# Patient Record
Sex: Female | Born: 1940
Health system: Southern US, Community
[De-identification: ages and names within clinical notes are randomized; demographics above are authoritative.]

## PROBLEM LIST (undated history)

## (undated) DIAGNOSIS — E7800 Pure hypercholesterolemia, unspecified: Secondary | ICD-10-CM

## (undated) DIAGNOSIS — J449 Chronic obstructive pulmonary disease, unspecified: Secondary | ICD-10-CM

## (undated) HISTORY — PX: ABDOMINAL HYSTERECTOMY: SHX81

---

## 1968-01-02 DIAGNOSIS — C55 Malignant neoplasm of uterus, part unspecified: Secondary | ICD-10-CM

## 1968-01-02 HISTORY — DX: Malignant neoplasm of uterus, part unspecified: C55

## 1997-06-14 ENCOUNTER — Other Ambulatory Visit: Admission: RE | Admit: 1997-06-14 | Discharge: 1997-06-14 | Payer: Self-pay | Admitting: Endocrinology

## 1997-08-13 ENCOUNTER — Ambulatory Visit (HOSPITAL_COMMUNITY): Admission: RE | Admit: 1997-08-13 | Discharge: 1997-08-13 | Payer: Self-pay | Admitting: Endocrinology

## 1998-06-23 ENCOUNTER — Other Ambulatory Visit: Admission: RE | Admit: 1998-06-23 | Discharge: 1998-06-23 | Payer: Self-pay | Admitting: Endocrinology

## 1999-01-18 ENCOUNTER — Ambulatory Visit (HOSPITAL_COMMUNITY): Admission: RE | Admit: 1999-01-18 | Discharge: 1999-01-18 | Payer: Self-pay | Admitting: Endocrinology

## 1999-01-18 ENCOUNTER — Encounter: Payer: Self-pay | Admitting: Endocrinology

## 1999-10-02 ENCOUNTER — Other Ambulatory Visit: Admission: RE | Admit: 1999-10-02 | Discharge: 1999-10-02 | Payer: Self-pay | Admitting: Endocrinology

## 2000-11-19 ENCOUNTER — Encounter: Payer: Self-pay | Admitting: Obstetrics and Gynecology

## 2000-11-19 ENCOUNTER — Encounter: Admission: RE | Admit: 2000-11-19 | Discharge: 2000-11-19 | Payer: Self-pay | Admitting: Obstetrics and Gynecology

## 2003-06-24 ENCOUNTER — Encounter: Admission: RE | Admit: 2003-06-24 | Discharge: 2003-06-24 | Payer: Self-pay | Admitting: Endocrinology

## 2004-02-15 ENCOUNTER — Ambulatory Visit (HOSPITAL_COMMUNITY): Admission: RE | Admit: 2004-02-15 | Discharge: 2004-02-15 | Payer: Self-pay | Admitting: *Deleted

## 2004-02-15 ENCOUNTER — Encounter (INDEPENDENT_AMBULATORY_CARE_PROVIDER_SITE_OTHER): Payer: Self-pay | Admitting: Specialist

## 2004-12-14 ENCOUNTER — Encounter: Admission: RE | Admit: 2004-12-14 | Discharge: 2004-12-14 | Payer: Self-pay | Admitting: Endocrinology

## 2005-01-10 ENCOUNTER — Encounter: Admission: RE | Admit: 2005-01-10 | Discharge: 2005-01-10 | Payer: Self-pay | Admitting: Endocrinology

## 2006-10-13 ENCOUNTER — Emergency Department (HOSPITAL_COMMUNITY): Admission: EM | Admit: 2006-10-13 | Discharge: 2006-10-13 | Payer: Self-pay | Admitting: Emergency Medicine

## 2010-05-19 NOTE — Op Note (Signed)
NAMECHASLYN, Sarah Rojas NO.:  [id number]   MEDICAL RECORD NO.:  [id number]          PATIENT TYPE:  AMB   LOCATION:  ENDO                         FACILITY:  [facility]   PHYSICIAN:  Georgiana Spinner, M.D.    DATE OF BIRTH:  [birth year]   DATE OF PROCEDURE:  DATE OF DISCHARGE:                                 OPERATIVE REPORT   PROCEDURE:  Upper endoscopy.   INDICATION:  GERD.   ANESTHESIA:  Demerol 75 mg, Versed 7.5 mg.   DESCRIPTION OF PROCEDURE:  With the patient mildly sedated in the left  lateral decubitus position, the Olympus videoscopic endoscope was inserted  into the mouth and passed under direct vision through the esophagus which  appeared normal until it reached the distal esophagus and there was one  small patch of an island of Barrett's esophagus, it was photographed and  biopsied.  The endoscope was then advanced into the stomach and the fundus,  body, antrum, duodenal bulb, and second portion of the duodenum were  visualized.  From this point, the endoscope was slowly withdrawn, taking  circumferential views of the duodenal mucosa until the endoscope had been  pulled into the stomach and placed in retroflexion, viewing the stomach from  below and a widely patent GE junction was noted.  We could see up the  esophagus.  The endoscope was then straightened and withdrawn, taking  circumferential views of the remaining gastric and esophageal mucosa,  stopping in the body of the stomach where a diffuse erythema was seen,  photographed and biopsied.  The patient's vital signs and pulse oximetry  remained stable, the patient tolerated the procedure well without apparent  complications.   FINDINGS:  1.  Diffuse gastritis, biopsied.  2.  Island of Barrett's esophagus, biopsied.   PLAN:  Await biopsy report, the patient will call me for results and follow  up with me as an outpatient.   Of note, photographs taken were lost due to a printer malfunction.      GMO/MEDQ  D:  02/15/2004  T:  02/15/2004  Job:  [id number]

## 2010-05-19 NOTE — Op Note (Signed)
NAMECHELSEI, MCCHESNEY NO.:  [id number]   MEDICAL RECORD NO.:  [id number]          PATIENT TYPE:  AMB   LOCATION:  ENDO                         FACILITY:  [facility]   PHYSICIAN:  Georgiana Spinner, M.D.    DATE OF BIRTH:  Feb 10, 1940   DATE OF PROCEDURE:  02/15/2004  DATE OF DISCHARGE:                                 OPERATIVE REPORT   PROCEDURE:  Colonoscopy.   INDICATIONS:  Polyps.   ANESTHESIA:  None further given.   DESCRIPTION OF PROCEDURE:  With the patient mildly sedated in the left  lateral decubitus position, the Olympus videoscopic colonoscope was inserted  in the rectum and passed under direct vision to the cecum, identified by  ileocecal valve and appendiceal orifice, both of which were photographed.  From this point, the colonoscope was slowly withdrawn, taking  circumferential views of the colonic mucosa, stopping at approximately 40 cm  from the anal verge.  A number of small polyps were seen.  Some were  photographed and all were removed using either hot biopsy forceps technique  or snare cautery technique on a setting of 20/200 blended current.  We then  pulled back to the rectum, which appeared normal in direct view and showed a  small polyp and hemorrhoids on retroflexed view.  The polyp was removed  using hot biopsy forceps technique at the same setting.  The endoscope was  then straightened and withdrawn.  The patient's vital signs and pulse  oximetry remained stable.  The patient tolerated the procedure well without  apparent complication.   FINDINGS:  1.  Multiple polyps at around 40 cm from the anal verge.  2.  Polyp of the rectum.  3.  Internal hemorrhoids.  4.  Otherwise an unremarkable examination.   PLAN:  Await biopsy reports.  The patient will call me for results and  follow up with me as an outpatient.      GMO/MEDQ  D:  02/15/2004  T:  02/15/2004  Job:  [id number]

## 2010-10-12 LAB — BASIC METABOLIC PANEL
CO2: 26
Calcium: 9.5
Chloride: 101
GFR calc non Af Amer: >60

## 2010-10-12 LAB — DIFFERENTIAL
Eosinophils Absolute: 0.2
Lymphocytes Relative: 10 — ABNORMAL LOW
Monocytes Absolute: 0.8 — ABNORMAL HIGH
Monocytes Relative: 5
Neutro Abs: 12 — ABNORMAL HIGH

## 2010-10-12 LAB — CULTURE, BLOOD (ROUTINE X 2)
Culture: NO GROWTH
Culture: NO GROWTH

## 2010-10-12 LAB — CBC
HCT: 39.1
MCHC: 35.1
MCV: 92.9
Platelets: 298

## 2014-03-01 DIAGNOSIS — H524 Presbyopia: Secondary | ICD-10-CM | POA: Diagnosis not present

## 2014-03-01 DIAGNOSIS — H5213 Myopia, bilateral: Secondary | ICD-10-CM | POA: Diagnosis not present

## 2014-03-01 DIAGNOSIS — H52209 Unspecified astigmatism, unspecified eye: Secondary | ICD-10-CM | POA: Diagnosis not present

## 2014-03-01 DIAGNOSIS — Z0100 Encounter for examination of eyes and vision without abnormal findings: Secondary | ICD-10-CM | POA: Diagnosis not present

## 2014-03-25 DIAGNOSIS — E0789 Other specified disorders of thyroid: Secondary | ICD-10-CM | POA: Diagnosis not present

## 2014-03-25 DIAGNOSIS — E756 Lipid storage disorder, unspecified: Secondary | ICD-10-CM | POA: Diagnosis not present

## 2014-03-25 DIAGNOSIS — Z79899 Other long term (current) drug therapy: Secondary | ICD-10-CM | POA: Diagnosis not present

## 2014-04-06 DIAGNOSIS — J449 Chronic obstructive pulmonary disease, unspecified: Secondary | ICD-10-CM | POA: Diagnosis not present

## 2014-04-06 DIAGNOSIS — F17200 Nicotine dependence, unspecified, uncomplicated: Secondary | ICD-10-CM | POA: Diagnosis not present

## 2014-04-06 DIAGNOSIS — L309 Dermatitis, unspecified: Secondary | ICD-10-CM | POA: Diagnosis not present

## 2014-04-06 DIAGNOSIS — R5383 Other fatigue: Secondary | ICD-10-CM | POA: Diagnosis not present

## 2014-04-06 DIAGNOSIS — F419 Anxiety disorder, unspecified: Secondary | ICD-10-CM | POA: Diagnosis not present

## 2014-10-07 DIAGNOSIS — E789 Disorder of lipoprotein metabolism, unspecified: Secondary | ICD-10-CM | POA: Diagnosis not present

## 2014-10-20 DIAGNOSIS — F5101 Primary insomnia: Secondary | ICD-10-CM | POA: Diagnosis not present

## 2014-10-20 DIAGNOSIS — E789 Disorder of lipoprotein metabolism, unspecified: Secondary | ICD-10-CM | POA: Diagnosis not present

## 2014-10-20 DIAGNOSIS — Z23 Encounter for immunization: Secondary | ICD-10-CM | POA: Diagnosis not present

## 2014-10-20 DIAGNOSIS — R5383 Other fatigue: Secondary | ICD-10-CM | POA: Diagnosis not present

## 2014-10-20 DIAGNOSIS — R002 Palpitations: Secondary | ICD-10-CM | POA: Diagnosis not present

## 2015-01-24 DIAGNOSIS — E789 Disorder of lipoprotein metabolism, unspecified: Secondary | ICD-10-CM | POA: Diagnosis not present

## 2015-01-24 DIAGNOSIS — R05 Cough: Secondary | ICD-10-CM | POA: Diagnosis not present

## 2015-01-24 DIAGNOSIS — R0602 Shortness of breath: Secondary | ICD-10-CM | POA: Diagnosis not present

## 2015-04-04 DIAGNOSIS — F5101 Primary insomnia: Secondary | ICD-10-CM | POA: Diagnosis not present

## 2015-04-04 DIAGNOSIS — R002 Palpitations: Secondary | ICD-10-CM | POA: Diagnosis not present

## 2015-04-04 DIAGNOSIS — E0789 Other specified disorders of thyroid: Secondary | ICD-10-CM | POA: Diagnosis not present

## 2015-04-04 DIAGNOSIS — E789 Disorder of lipoprotein metabolism, unspecified: Secondary | ICD-10-CM | POA: Diagnosis not present

## 2015-04-12 DIAGNOSIS — J42 Unspecified chronic bronchitis: Secondary | ICD-10-CM | POA: Diagnosis not present

## 2015-04-12 DIAGNOSIS — B029 Zoster without complications: Secondary | ICD-10-CM | POA: Diagnosis not present

## 2015-04-12 DIAGNOSIS — M25511 Pain in right shoulder: Secondary | ICD-10-CM | POA: Diagnosis not present

## 2015-04-12 DIAGNOSIS — M25519 Pain in unspecified shoulder: Secondary | ICD-10-CM | POA: Diagnosis not present

## 2015-04-12 DIAGNOSIS — E039 Hypothyroidism, unspecified: Secondary | ICD-10-CM | POA: Diagnosis not present

## 2015-04-12 DIAGNOSIS — J449 Chronic obstructive pulmonary disease, unspecified: Secondary | ICD-10-CM | POA: Diagnosis not present

## 2015-04-13 DIAGNOSIS — H353131 Nonexudative age-related macular degeneration, bilateral, early dry stage: Secondary | ICD-10-CM | POA: Diagnosis not present

## 2015-04-13 DIAGNOSIS — Z961 Presence of intraocular lens: Secondary | ICD-10-CM | POA: Diagnosis not present

## 2015-04-13 DIAGNOSIS — B0230 Zoster ocular disease, unspecified: Secondary | ICD-10-CM | POA: Diagnosis not present

## 2015-04-13 DIAGNOSIS — H04123 Dry eye syndrome of bilateral lacrimal glands: Secondary | ICD-10-CM | POA: Diagnosis not present

## 2015-05-10 DIAGNOSIS — H04123 Dry eye syndrome of bilateral lacrimal glands: Secondary | ICD-10-CM | POA: Diagnosis not present

## 2015-05-10 DIAGNOSIS — H353131 Nonexudative age-related macular degeneration, bilateral, early dry stage: Secondary | ICD-10-CM | POA: Diagnosis not present

## 2015-05-10 DIAGNOSIS — Z961 Presence of intraocular lens: Secondary | ICD-10-CM | POA: Diagnosis not present

## 2015-06-06 DIAGNOSIS — M542 Cervicalgia: Secondary | ICD-10-CM | POA: Diagnosis not present

## 2015-06-06 DIAGNOSIS — R1310 Dysphagia, unspecified: Secondary | ICD-10-CM | POA: Diagnosis not present

## 2015-06-07 DIAGNOSIS — M25411 Effusion, right shoulder: Secondary | ICD-10-CM | POA: Diagnosis not present

## 2015-06-07 DIAGNOSIS — M2540 Effusion, unspecified joint: Secondary | ICD-10-CM | POA: Diagnosis not present

## 2015-06-07 DIAGNOSIS — M47812 Spondylosis without myelopathy or radiculopathy, cervical region: Secondary | ICD-10-CM | POA: Diagnosis not present

## 2015-06-08 ENCOUNTER — Other Ambulatory Visit: Payer: Self-pay | Admitting: Orthopaedic Surgery

## 2015-06-08 DIAGNOSIS — M8580 Other specified disorders of bone density and structure, unspecified site: Secondary | ICD-10-CM

## 2015-06-10 ENCOUNTER — Other Ambulatory Visit: Payer: Self-pay | Admitting: Orthopaedic Surgery

## 2015-06-10 DIAGNOSIS — M4802 Spinal stenosis, cervical region: Secondary | ICD-10-CM

## 2015-06-17 ENCOUNTER — Ambulatory Visit
Admission: RE | Admit: 2015-06-17 | Discharge: 2015-06-17 | Disposition: A | Discharge status: Home / Self Care | Payer: Commercial Managed Care - HMO | Source: Ambulatory Visit | Attending: Orthopaedic Surgery | Admitting: Orthopaedic Surgery

## 2015-06-17 DIAGNOSIS — M4802 Spinal stenosis, cervical region: Secondary | ICD-10-CM

## 2015-06-17 DIAGNOSIS — M50223 Other cervical disc displacement at C6-C7 level: Secondary | ICD-10-CM | POA: Diagnosis not present

## 2015-06-17 DIAGNOSIS — M50221 Other cervical disc displacement at C4-C5 level: Secondary | ICD-10-CM | POA: Diagnosis not present

## 2015-06-17 MED ORDER — GADOBENATE DIMEGLUMINE 529 MG/ML IV SOLN
9.0000 mL | Freq: Once | INTRAVENOUS | Status: AC | PRN
  Administered 2015-06-17: 9 mL via INTRAVENOUS

## 2015-06-24 ENCOUNTER — Ambulatory Visit
Admission: RE | Admit: 2015-06-24 | Discharge: 2015-06-24 | Disposition: A | Discharge status: Home / Self Care | Payer: Commercial Managed Care - HMO | Source: Ambulatory Visit | Attending: Orthopaedic Surgery | Admitting: Orthopaedic Surgery

## 2015-06-24 ENCOUNTER — Other Ambulatory Visit: Payer: Self-pay

## 2015-06-24 DIAGNOSIS — M8580 Other specified disorders of bone density and structure, unspecified site: Secondary | ICD-10-CM

## 2015-06-24 DIAGNOSIS — M810 Age-related osteoporosis without current pathological fracture: Secondary | ICD-10-CM | POA: Diagnosis not present

## 2015-06-24 DIAGNOSIS — Z780 Asymptomatic menopausal state: Secondary | ICD-10-CM | POA: Diagnosis not present

## 2015-06-28 DIAGNOSIS — M810 Age-related osteoporosis without current pathological fracture: Secondary | ICD-10-CM | POA: Diagnosis not present

## 2015-06-28 DIAGNOSIS — G5601 Carpal tunnel syndrome, right upper limb: Secondary | ICD-10-CM | POA: Diagnosis not present

## 2015-06-28 DIAGNOSIS — M542 Cervicalgia: Secondary | ICD-10-CM | POA: Diagnosis not present

## 2015-06-28 DIAGNOSIS — G561 Other lesions of median nerve: Secondary | ICD-10-CM | POA: Diagnosis not present

## 2015-06-28 DIAGNOSIS — G5602 Carpal tunnel syndrome, left upper limb: Secondary | ICD-10-CM | POA: Diagnosis not present

## 2015-07-12 DIAGNOSIS — R1310 Dysphagia, unspecified: Secondary | ICD-10-CM | POA: Diagnosis not present

## 2015-07-12 DIAGNOSIS — E789 Disorder of lipoprotein metabolism, unspecified: Secondary | ICD-10-CM | POA: Diagnosis not present

## 2015-07-12 DIAGNOSIS — M542 Cervicalgia: Secondary | ICD-10-CM | POA: Diagnosis not present

## 2015-07-22 DIAGNOSIS — R202 Paresthesia of skin: Secondary | ICD-10-CM | POA: Diagnosis not present

## 2016-02-15 DIAGNOSIS — R5383 Other fatigue: Secondary | ICD-10-CM | POA: Diagnosis not present

## 2016-02-15 DIAGNOSIS — Z79899 Other long term (current) drug therapy: Secondary | ICD-10-CM | POA: Diagnosis not present

## 2016-02-15 DIAGNOSIS — M549 Dorsalgia, unspecified: Secondary | ICD-10-CM | POA: Diagnosis not present

## 2016-02-15 DIAGNOSIS — J329 Chronic sinusitis, unspecified: Secondary | ICD-10-CM | POA: Diagnosis not present

## 2016-02-15 DIAGNOSIS — G629 Polyneuropathy, unspecified: Secondary | ICD-10-CM | POA: Diagnosis not present

## 2016-02-15 DIAGNOSIS — F411 Generalized anxiety disorder: Secondary | ICD-10-CM | POA: Diagnosis not present

## 2016-05-14 DIAGNOSIS — Z961 Presence of intraocular lens: Secondary | ICD-10-CM | POA: Diagnosis not present

## 2016-05-14 DIAGNOSIS — H353131 Nonexudative age-related macular degeneration, bilateral, early dry stage: Secondary | ICD-10-CM | POA: Diagnosis not present

## 2016-05-14 DIAGNOSIS — H04123 Dry eye syndrome of bilateral lacrimal glands: Secondary | ICD-10-CM | POA: Diagnosis not present

## 2016-11-05 DIAGNOSIS — H8110 Benign paroxysmal vertigo, unspecified ear: Secondary | ICD-10-CM | POA: Diagnosis not present

## 2016-11-05 DIAGNOSIS — R55 Syncope and collapse: Secondary | ICD-10-CM | POA: Diagnosis not present

## 2016-11-05 DIAGNOSIS — F17200 Nicotine dependence, unspecified, uncomplicated: Secondary | ICD-10-CM | POA: Diagnosis not present

## 2016-11-05 DIAGNOSIS — E0789 Other specified disorders of thyroid: Secondary | ICD-10-CM | POA: Diagnosis not present

## 2016-11-05 DIAGNOSIS — E782 Mixed hyperlipidemia: Secondary | ICD-10-CM | POA: Diagnosis not present

## 2016-11-07 ENCOUNTER — Other Ambulatory Visit: Payer: Self-pay | Admitting: Internal Medicine

## 2016-11-07 DIAGNOSIS — R55 Syncope and collapse: Secondary | ICD-10-CM

## 2016-11-08 DIAGNOSIS — R55 Syncope and collapse: Secondary | ICD-10-CM | POA: Diagnosis not present

## 2016-11-08 DIAGNOSIS — I6523 Occlusion and stenosis of bilateral carotid arteries: Secondary | ICD-10-CM | POA: Diagnosis not present

## 2016-11-08 DIAGNOSIS — R0600 Dyspnea, unspecified: Secondary | ICD-10-CM | POA: Diagnosis not present

## 2016-11-13 ENCOUNTER — Ambulatory Visit
Admission: RE | Admit: 2016-11-13 | Discharge: 2016-11-13 | Disposition: A | Discharge status: Home / Self Care | Payer: Medicare HMO | Source: Ambulatory Visit | Attending: Internal Medicine | Admitting: Internal Medicine

## 2016-11-13 DIAGNOSIS — R55 Syncope and collapse: Secondary | ICD-10-CM | POA: Diagnosis not present

## 2016-12-04 DIAGNOSIS — E789 Disorder of lipoprotein metabolism, unspecified: Secondary | ICD-10-CM | POA: Diagnosis not present

## 2016-12-04 DIAGNOSIS — E782 Mixed hyperlipidemia: Secondary | ICD-10-CM | POA: Diagnosis not present

## 2016-12-04 DIAGNOSIS — H8110 Benign paroxysmal vertigo, unspecified ear: Secondary | ICD-10-CM | POA: Diagnosis not present

## 2016-12-04 DIAGNOSIS — J069 Acute upper respiratory infection, unspecified: Secondary | ICD-10-CM | POA: Diagnosis not present

## 2017-04-22 DIAGNOSIS — H8110 Benign paroxysmal vertigo, unspecified ear: Secondary | ICD-10-CM | POA: Diagnosis not present

## 2017-04-22 DIAGNOSIS — Z0000 Encounter for general adult medical examination without abnormal findings: Secondary | ICD-10-CM | POA: Diagnosis not present

## 2017-04-22 DIAGNOSIS — E782 Mixed hyperlipidemia: Secondary | ICD-10-CM | POA: Diagnosis not present

## 2017-04-22 DIAGNOSIS — Z23 Encounter for immunization: Secondary | ICD-10-CM | POA: Diagnosis not present

## 2017-04-22 DIAGNOSIS — E789 Disorder of lipoprotein metabolism, unspecified: Secondary | ICD-10-CM | POA: Diagnosis not present

## 2017-04-29 DIAGNOSIS — E782 Mixed hyperlipidemia: Secondary | ICD-10-CM | POA: Diagnosis not present

## 2017-04-29 DIAGNOSIS — M25539 Pain in unspecified wrist: Secondary | ICD-10-CM | POA: Diagnosis not present

## 2017-04-29 DIAGNOSIS — H8110 Benign paroxysmal vertigo, unspecified ear: Secondary | ICD-10-CM | POA: Diagnosis not present

## 2017-04-29 DIAGNOSIS — M25511 Pain in right shoulder: Secondary | ICD-10-CM | POA: Diagnosis not present

## 2017-04-29 DIAGNOSIS — E789 Disorder of lipoprotein metabolism, unspecified: Secondary | ICD-10-CM | POA: Diagnosis not present

## 2017-04-29 DIAGNOSIS — L309 Dermatitis, unspecified: Secondary | ICD-10-CM | POA: Diagnosis not present

## 2017-04-29 DIAGNOSIS — H6121 Impacted cerumen, right ear: Secondary | ICD-10-CM | POA: Diagnosis not present

## 2017-05-20 DIAGNOSIS — M25511 Pain in right shoulder: Secondary | ICD-10-CM | POA: Diagnosis not present

## 2017-05-20 DIAGNOSIS — H6121 Impacted cerumen, right ear: Secondary | ICD-10-CM | POA: Diagnosis not present

## 2017-05-20 DIAGNOSIS — H8110 Benign paroxysmal vertigo, unspecified ear: Secondary | ICD-10-CM | POA: Diagnosis not present

## 2017-05-20 DIAGNOSIS — E782 Mixed hyperlipidemia: Secondary | ICD-10-CM | POA: Diagnosis not present

## 2017-05-20 DIAGNOSIS — L309 Dermatitis, unspecified: Secondary | ICD-10-CM | POA: Diagnosis not present

## 2017-06-06 DIAGNOSIS — H04123 Dry eye syndrome of bilateral lacrimal glands: Secondary | ICD-10-CM | POA: Diagnosis not present

## 2017-06-06 DIAGNOSIS — Z961 Presence of intraocular lens: Secondary | ICD-10-CM | POA: Diagnosis not present

## 2017-06-06 DIAGNOSIS — H353131 Nonexudative age-related macular degeneration, bilateral, early dry stage: Secondary | ICD-10-CM | POA: Diagnosis not present

## 2017-09-24 DIAGNOSIS — R69 Illness, unspecified: Secondary | ICD-10-CM | POA: Diagnosis not present

## 2017-12-02 DIAGNOSIS — H8110 Benign paroxysmal vertigo, unspecified ear: Secondary | ICD-10-CM | POA: Diagnosis not present

## 2017-12-02 DIAGNOSIS — E782 Mixed hyperlipidemia: Secondary | ICD-10-CM | POA: Diagnosis not present

## 2018-07-08 DIAGNOSIS — R69 Illness, unspecified: Secondary | ICD-10-CM | POA: Diagnosis not present

## 2018-07-08 DIAGNOSIS — H8110 Benign paroxysmal vertigo, unspecified ear: Secondary | ICD-10-CM | POA: Diagnosis not present

## 2018-07-08 DIAGNOSIS — E782 Mixed hyperlipidemia: Secondary | ICD-10-CM | POA: Diagnosis not present

## 2018-07-08 DIAGNOSIS — R5383 Other fatigue: Secondary | ICD-10-CM | POA: Diagnosis not present

## 2018-07-08 DIAGNOSIS — M25511 Pain in right shoulder: Secondary | ICD-10-CM | POA: Diagnosis not present

## 2018-07-08 DIAGNOSIS — L309 Dermatitis, unspecified: Secondary | ICD-10-CM | POA: Diagnosis not present

## 2018-07-08 DIAGNOSIS — R1011 Right upper quadrant pain: Secondary | ICD-10-CM | POA: Diagnosis not present

## 2018-07-08 DIAGNOSIS — E789 Disorder of lipoprotein metabolism, unspecified: Secondary | ICD-10-CM | POA: Diagnosis not present

## 2018-07-08 DIAGNOSIS — Z0000 Encounter for general adult medical examination without abnormal findings: Secondary | ICD-10-CM | POA: Diagnosis not present

## 2018-07-09 DIAGNOSIS — M25511 Pain in right shoulder: Secondary | ICD-10-CM | POA: Diagnosis not present

## 2018-07-09 DIAGNOSIS — Z7189 Other specified counseling: Secondary | ICD-10-CM | POA: Diagnosis not present

## 2018-09-30 DIAGNOSIS — R69 Illness, unspecified: Secondary | ICD-10-CM | POA: Diagnosis not present

## 2018-10-21 DIAGNOSIS — H8110 Benign paroxysmal vertigo, unspecified ear: Secondary | ICD-10-CM | POA: Diagnosis not present

## 2018-10-21 DIAGNOSIS — R69 Illness, unspecified: Secondary | ICD-10-CM | POA: Diagnosis not present

## 2018-10-21 DIAGNOSIS — M25559 Pain in unspecified hip: Secondary | ICD-10-CM | POA: Diagnosis not present

## 2018-10-22 DIAGNOSIS — H52203 Unspecified astigmatism, bilateral: Secondary | ICD-10-CM | POA: Diagnosis not present

## 2018-10-22 DIAGNOSIS — H5211 Myopia, right eye: Secondary | ICD-10-CM | POA: Diagnosis not present

## 2018-10-22 DIAGNOSIS — Z961 Presence of intraocular lens: Secondary | ICD-10-CM | POA: Diagnosis not present

## 2018-10-22 DIAGNOSIS — H524 Presbyopia: Secondary | ICD-10-CM | POA: Diagnosis not present

## 2018-10-22 DIAGNOSIS — H353131 Nonexudative age-related macular degeneration, bilateral, early dry stage: Secondary | ICD-10-CM | POA: Diagnosis not present

## 2018-10-22 DIAGNOSIS — H04123 Dry eye syndrome of bilateral lacrimal glands: Secondary | ICD-10-CM | POA: Diagnosis not present

## 2019-01-21 DIAGNOSIS — H8110 Benign paroxysmal vertigo, unspecified ear: Secondary | ICD-10-CM | POA: Diagnosis not present

## 2019-01-21 DIAGNOSIS — E789 Disorder of lipoprotein metabolism, unspecified: Secondary | ICD-10-CM | POA: Diagnosis not present

## 2019-01-21 DIAGNOSIS — R5383 Other fatigue: Secondary | ICD-10-CM | POA: Diagnosis not present

## 2019-01-21 DIAGNOSIS — E782 Mixed hyperlipidemia: Secondary | ICD-10-CM | POA: Diagnosis not present

## 2019-02-05 DIAGNOSIS — M8949 Other hypertrophic osteoarthropathy, multiple sites: Secondary | ICD-10-CM | POA: Diagnosis not present

## 2019-02-05 DIAGNOSIS — R69 Illness, unspecified: Secondary | ICD-10-CM | POA: Diagnosis not present

## 2019-02-05 DIAGNOSIS — J321 Chronic frontal sinusitis: Secondary | ICD-10-CM | POA: Diagnosis not present

## 2019-02-05 DIAGNOSIS — E782 Mixed hyperlipidemia: Secondary | ICD-10-CM | POA: Diagnosis not present

## 2019-02-05 DIAGNOSIS — J301 Allergic rhinitis due to pollen: Secondary | ICD-10-CM | POA: Diagnosis not present

## 2019-05-18 ENCOUNTER — Other Ambulatory Visit: Payer: Self-pay

## 2019-05-18 ENCOUNTER — Emergency Department (HOSPITAL_COMMUNITY): Payer: Medicare HMO

## 2019-05-18 ENCOUNTER — Inpatient Hospital Stay (HOSPITAL_COMMUNITY)
Admission: EM | Admit: 2019-05-18 | Discharge: 2019-05-21 | DRG: 640 | Disposition: A | Discharge status: Home Health | Payer: Medicare HMO | Attending: Internal Medicine | Admitting: Internal Medicine

## 2019-05-18 ENCOUNTER — Encounter (HOSPITAL_COMMUNITY): Payer: Self-pay | Admitting: Emergency Medicine

## 2019-05-18 DIAGNOSIS — Z20822 Contact with and (suspected) exposure to covid-19: Secondary | ICD-10-CM | POA: Diagnosis present

## 2019-05-18 DIAGNOSIS — I639 Cerebral infarction, unspecified: Secondary | ICD-10-CM | POA: Diagnosis not present

## 2019-05-18 DIAGNOSIS — I951 Orthostatic hypotension: Secondary | ICD-10-CM | POA: Diagnosis present

## 2019-05-18 DIAGNOSIS — I6389 Other cerebral infarction: Secondary | ICD-10-CM | POA: Diagnosis not present

## 2019-05-18 DIAGNOSIS — Z79899 Other long term (current) drug therapy: Secondary | ICD-10-CM

## 2019-05-18 DIAGNOSIS — M25561 Pain in right knee: Secondary | ICD-10-CM | POA: Diagnosis present

## 2019-05-18 DIAGNOSIS — E162 Hypoglycemia, unspecified: Secondary | ICD-10-CM | POA: Diagnosis present

## 2019-05-18 DIAGNOSIS — R296 Repeated falls: Secondary | ICD-10-CM | POA: Diagnosis present

## 2019-05-18 DIAGNOSIS — W19XXXA Unspecified fall, initial encounter: Secondary | ICD-10-CM | POA: Diagnosis not present

## 2019-05-18 DIAGNOSIS — I6523 Occlusion and stenosis of bilateral carotid arteries: Secondary | ICD-10-CM | POA: Diagnosis not present

## 2019-05-18 DIAGNOSIS — R1310 Dysphagia, unspecified: Secondary | ICD-10-CM | POA: Diagnosis present

## 2019-05-18 DIAGNOSIS — F17210 Nicotine dependence, cigarettes, uncomplicated: Secondary | ICD-10-CM | POA: Diagnosis present

## 2019-05-18 DIAGNOSIS — S199XXA Unspecified injury of neck, initial encounter: Secondary | ICD-10-CM | POA: Diagnosis not present

## 2019-05-18 DIAGNOSIS — E7800 Pure hypercholesterolemia, unspecified: Secondary | ICD-10-CM | POA: Diagnosis present

## 2019-05-18 DIAGNOSIS — Z7982 Long term (current) use of aspirin: Secondary | ICD-10-CM | POA: Diagnosis not present

## 2019-05-18 DIAGNOSIS — N179 Acute kidney failure, unspecified: Secondary | ICD-10-CM | POA: Diagnosis not present

## 2019-05-18 DIAGNOSIS — R918 Other nonspecific abnormal finding of lung field: Secondary | ICD-10-CM | POA: Diagnosis present

## 2019-05-18 DIAGNOSIS — R636 Underweight: Secondary | ICD-10-CM | POA: Diagnosis present

## 2019-05-18 DIAGNOSIS — I959 Hypotension, unspecified: Secondary | ICD-10-CM | POA: Diagnosis not present

## 2019-05-18 DIAGNOSIS — R4781 Slurred speech: Secondary | ICD-10-CM | POA: Diagnosis not present

## 2019-05-18 DIAGNOSIS — E860 Dehydration: Secondary | ICD-10-CM | POA: Diagnosis not present

## 2019-05-18 DIAGNOSIS — J9601 Acute respiratory failure with hypoxia: Secondary | ICD-10-CM | POA: Diagnosis not present

## 2019-05-18 DIAGNOSIS — J439 Emphysema, unspecified: Secondary | ICD-10-CM | POA: Diagnosis present

## 2019-05-18 DIAGNOSIS — E785 Hyperlipidemia, unspecified: Secondary | ICD-10-CM | POA: Diagnosis present

## 2019-05-18 DIAGNOSIS — I2510 Atherosclerotic heart disease of native coronary artery without angina pectoris: Secondary | ICD-10-CM | POA: Diagnosis not present

## 2019-05-18 DIAGNOSIS — Z720 Tobacco use: Secondary | ICD-10-CM | POA: Diagnosis present

## 2019-05-18 DIAGNOSIS — Z91040 Latex allergy status: Secondary | ICD-10-CM

## 2019-05-18 DIAGNOSIS — R0902 Hypoxemia: Secondary | ICD-10-CM | POA: Diagnosis not present

## 2019-05-18 DIAGNOSIS — R4701 Aphasia: Secondary | ICD-10-CM | POA: Diagnosis present

## 2019-05-18 DIAGNOSIS — R29810 Facial weakness: Secondary | ICD-10-CM | POA: Diagnosis not present

## 2019-05-18 DIAGNOSIS — R0602 Shortness of breath: Secondary | ICD-10-CM | POA: Diagnosis not present

## 2019-05-18 DIAGNOSIS — R531 Weakness: Secondary | ICD-10-CM

## 2019-05-18 DIAGNOSIS — Z681 Body mass index (BMI) 19 or less, adult: Secondary | ICD-10-CM | POA: Diagnosis not present

## 2019-05-18 DIAGNOSIS — S0990XA Unspecified injury of head, initial encounter: Secondary | ICD-10-CM | POA: Diagnosis not present

## 2019-05-18 DIAGNOSIS — S3993XA Unspecified injury of pelvis, initial encounter: Secondary | ICD-10-CM | POA: Diagnosis not present

## 2019-05-18 HISTORY — DX: Pure hypercholesterolemia, unspecified: E78.00

## 2019-05-18 LAB — COMPREHENSIVE METABOLIC PANEL
ALT: 12 U/L (ref 0–44)
AST: 27 U/L (ref 15–41)
Albumin: 3.6 g/dL (ref 3.5–5.0)
Alkaline Phosphatase: 40 U/L (ref 38–126)
Anion gap: 11 (ref 5–15)
BUN: 20 mg/dL (ref 8–23)
CO2: 22 mmol/L (ref 22–32)
Calcium: 8.8 mg/dL — ABNORMAL LOW (ref 8.9–10.3)
Chloride: 107 mmol/L (ref 98–111)
Creatinine, Ser: 1.28 mg/dL — ABNORMAL HIGH (ref 0.44–1.00)
GFR calc Af Amer: 46 mL/min — ABNORMAL LOW (ref >60)
GFR calc non Af Amer: 40 mL/min — ABNORMAL LOW (ref >60)
Glucose, Bld: 77 mg/dL (ref 70–99)
Potassium: 4 mmol/L (ref 3.5–5.1)
Sodium: 140 mmol/L (ref 135–145)
Total Bilirubin: 0.4 mg/dL (ref 0.3–1.2)
Total Protein: 6.1 g/dL — ABNORMAL LOW (ref 6.5–8.1)

## 2019-05-18 LAB — CBC
HCT: 38.3 % (ref 36.0–46.0)
HCT: 39.8 % (ref 36.0–46.0)
Hemoglobin: 12.3 g/dL (ref 12.0–15.0)
Hemoglobin: 12.5 g/dL (ref 12.0–15.0)
MCH: 32.1 pg (ref 26.0–34.0)
MCH: 31.9 pg (ref 26.0–34.0)
MCHC: 32.1 g/dL (ref 30.0–36.0)
MCHC: 31.4 g/dL (ref 30.0–36.0)
MCV: 100 fL (ref 80.0–100.0)
MCV: 101.5 fL — ABNORMAL HIGH (ref 80.0–100.0)
Platelets: 163 K/uL (ref 150–400)
Platelets: 177 10*3/uL (ref 150–400)
RBC: 3.83 MIL/uL — ABNORMAL LOW (ref 3.87–5.11)
RBC: 3.92 MIL/uL (ref 3.87–5.11)
RDW: 13.2 % (ref 11.5–15.5)
RDW: 13.2 % (ref 11.5–15.5)
WBC: 7.7 K/uL (ref 4.0–10.5)
WBC: 6.1 10*3/uL (ref 4.0–10.5)
nRBC: 0 % (ref 0.0–0.2)
nRBC: 0 % (ref 0.0–0.2)

## 2019-05-18 LAB — I-STAT CHEM 8, ED
BUN: 23 mg/dL (ref 8–23)
Calcium, Ion: 1.06 mmol/L — ABNORMAL LOW (ref 1.15–1.40)
Chloride: 109 mmol/L (ref 98–111)
Creatinine, Ser: 1.2 mg/dL — ABNORMAL HIGH (ref 0.44–1.00)
Glucose, Bld: 69 mg/dL — ABNORMAL LOW (ref 70–99)
HCT: 36 % (ref 36.0–46.0)
Hemoglobin: 12.2 g/dL (ref 12.0–15.0)
Potassium: 3.9 mmol/L (ref 3.5–5.1)
Sodium: 140 mmol/L (ref 135–145)
TCO2: 27 mmol/L (ref 22–32)

## 2019-05-18 LAB — CREATININE, SERUM
Creatinine, Ser: 1.22 mg/dL — ABNORMAL HIGH (ref 0.44–1.00)
GFR calc Af Amer: 49 mL/min — ABNORMAL LOW (ref >60)
GFR calc non Af Amer: 42 mL/min — ABNORMAL LOW (ref >60)

## 2019-05-18 LAB — LIPID PANEL
Cholesterol: 157 mg/dL (ref 0–200)
HDL: 57 mg/dL (ref >40)
LDL Cholesterol: 85 mg/dL (ref 0–99)
Total CHOL/HDL Ratio: 2.8 RATIO
Triglycerides: 76 mg/dL (ref <150)
VLDL: 15 mg/dL (ref 0–40)

## 2019-05-18 LAB — PROTIME-INR
INR: 1.1 (ref 0.8–1.2)
Prothrombin Time: 13.6 seconds (ref 11.4–15.2)

## 2019-05-18 LAB — DIFFERENTIAL
Abs Immature Granulocytes: 0.02 10*3/uL (ref 0.00–0.07)
Basophils Absolute: 0.1 10*3/uL (ref 0.0–0.1)
Basophils Relative: 1 %
Eosinophils Absolute: 0.1 10*3/uL (ref 0.0–0.5)
Eosinophils Relative: 1 %
Immature Granulocytes: 0 %
Lymphocytes Relative: 16 %
Lymphs Abs: 1.2 10*3/uL (ref 0.7–4.0)
Monocytes Absolute: 0.6 10*3/uL (ref 0.1–1.0)
Monocytes Relative: 8 %
Neutro Abs: 5.7 10*3/uL (ref 1.7–7.7)
Neutrophils Relative %: 74 %

## 2019-05-18 LAB — CBG MONITORING, ED
Glucose-Capillary: 50 mg/dL — ABNORMAL LOW (ref 70–99)
Glucose-Capillary: 163 mg/dL — ABNORMAL HIGH (ref 70–99)

## 2019-05-18 LAB — CK: Total CK: 358 U/L — ABNORMAL HIGH (ref 38–234)

## 2019-05-18 LAB — BETA-HYDROXYBUTYRIC ACID: Beta-Hydroxybutyric Acid: 0.43 mmol/L — ABNORMAL HIGH (ref 0.05–0.27)

## 2019-05-18 LAB — ETHANOL: Alcohol, Ethyl (B): <10 mg/dL (ref <10)

## 2019-05-18 LAB — APTT: aPTT: 36 seconds (ref 24–36)

## 2019-05-18 MED ORDER — ASPIRIN 325 MG PO TABS: 325.0000 mg | ORAL_TABLET | Freq: Every day | ORAL | Status: DC

## 2019-05-18 MED ORDER — SODIUM CHLORIDE 0.9 % IV SOLN: INTRAVENOUS | Status: DC

## 2019-05-18 MED ORDER — STROKE: EARLY STAGES OF RECOVERY BOOK
Freq: Once | Status: AC
  Filled 2019-05-18: qty 1

## 2019-05-18 MED ORDER — ACETAMINOPHEN 650 MG RE SUPP: 650.0000 mg | RECTAL | Status: DC | PRN

## 2019-05-18 MED ORDER — ENOXAPARIN SODIUM 30 MG/0.3ML ~~LOC~~ SOLN
30.0000 mg | SUBCUTANEOUS | Status: DC
  Administered 2019-05-18 – 2019-05-20 (×3): 30 mg via SUBCUTANEOUS
  Filled 2019-05-18 (×3): qty 0.3

## 2019-05-18 MED ORDER — ACETAMINOPHEN 325 MG PO TABS: 650.0000 mg | ORAL_TABLET | ORAL | Status: DC | PRN

## 2019-05-18 MED ORDER — SODIUM CHLORIDE 0.9 % IV BOLUS
1000.0000 mL | Freq: Once | INTRAVENOUS | Status: AC
  Administered 2019-05-18: 1000 mL via INTRAVENOUS

## 2019-05-18 MED ORDER — ASPIRIN 300 MG RE SUPP: 300.0000 mg | Freq: Every day | RECTAL | Status: DC

## 2019-05-18 MED ORDER — IOHEXOL 350 MG/ML SOLN
75.0000 mL | Freq: Once | INTRAVENOUS | Status: AC | PRN
  Administered 2019-05-18: 60 mL via INTRAVENOUS

## 2019-05-18 MED ORDER — ACETAMINOPHEN 160 MG/5ML PO SOLN: 650.0000 mg | ORAL | Status: DC | PRN

## 2019-05-18 MED ORDER — DEXTROSE 50 % IV SOLN
1.0000 | Freq: Once | INTRAVENOUS | Status: AC
  Administered 2019-05-18: 50 mL via INTRAVENOUS
  Filled 2019-05-18: qty 50

## 2019-05-18 NOTE — ED Triage Notes (Signed)
To ED via [facility] from home with family stating that her speech is slow and decreased sensation in left arm. States has fallen x 2- unable to get up, early this am fell the first time, fell again at 15- states scooted around on floor until she could get to phone.

## 2019-05-18 NOTE — ED Notes (Signed)
Pt transported to CT ?

## 2019-05-18 NOTE — ED Provider Notes (Signed)
[facility] EMERGENCY DEPARTMENT Provider Note   CSN: [id number] Arrival date & time:        History Chief Complaint  Patient presents with  . Weakness  . Aphasia    Sarah Rojas is a 79 y.o. female with pertinent past medical history of high cholesterol that presents emergency department today via EMS for fall.  Patient states that she lives alone and fell yesterday and hit her head, admits to loss of consciousness but does not know for how long.  States that she also fell again this morning and hit her head again.  Denies any alcohol use, states that she is not on a blood thinner.  However states that she takes 2-3 Goody's powders daily for the last 10 years.   Daughter in room who is able to to relay most of the story.  Patient states that she fell this morning after drinking her her coffee, did not call her daughter until this afternoon.  Daughter states that she noticed some slurred speech, facial droop and eye droop on the right side for approximately 3 minutes today this afternoon along with generalized weakness.  Daughter also states that she had some aphasia for those 3 minutes.  Denies any drooling or weakness.  EMS note states that patient had some left sided upper extremity weakness and sensation changes.  States that she does not know why she fell, did not have any sensation changes before she fell.  States that she just lost vision and blacked out.  Denies any tunneling vision, nausea, vomiting, chest pain, palpitations, shortness of breath, pain before passing out.  This has never happened to her before.  Patient does not take any daily prescribed medications.  Currently denies any chest pain, shortness of breath, dizziness, headache, back pain, neck pain, vision changes, pain anywhere else.  States that her head feels sore along with her legs which she hit while she was falling down. Was in normal health before fall yesterday.  HPI     Past Medical  History:  Diagnosis Date  . High cholesterol     There are no problems to display for this patient.   History reviewed. No pertinent surgical history.   OB History   No obstetric history on file.     No family history on file.  Social History   Tobacco Use  . Smoking status: Current Every Day Smoker    Packs/day: 0.50    Types: Cigarettes  . Smokeless tobacco: Never Used  Substance Use Topics  . Alcohol use: Never  . Drug use: Never    Home Medications Prior to Admission medications   Not on File    Allergies    Patient has no allergy information on record.  Review of Systems   Review of Systems  Constitutional: Negative for chills, diaphoresis, fatigue and fever.  HENT: Negative for congestion, sore throat and trouble swallowing.   Eyes: Negative for pain and visual disturbance.  Respiratory: Negative for cough, shortness of breath and wheezing.   Cardiovascular: Negative for chest pain, palpitations and leg swelling.  Gastrointestinal: Negative for abdominal distention, abdominal pain, diarrhea, nausea and vomiting.  Genitourinary: Negative for difficulty urinating.  Musculoskeletal: Negative for back pain, neck pain and neck stiffness.  Skin: Negative for pallor.  Neurological: Positive for syncope, speech difficulty and weakness. Negative for dizziness, tremors, facial asymmetry, light-headedness, numbness and headaches.  Psychiatric/Behavioral: Negative for behavioral problems, confusion and dysphoric mood.    Physical Exam Updated  Vital Signs BP (!) 108/53   Pulse 69   Temp 98.4 F (36.9 C) (Oral)   Resp 17   Ht 5\' 1"  (1.549 m)   Wt 44.5 kg   SpO2 98%   BMI 18.52 kg/m   Physical Exam Constitutional:      General: She is not in acute distress.    Appearance: Normal appearance. She is not ill-appearing, toxic-appearing or diaphoretic.  HENT:     Mouth/Throat:     Mouth: Mucous membranes are dry.     Pharynx: Oropharynx is clear.  Eyes:      General: No visual field deficit or scleral icterus.    Extraocular Movements: Extraocular movements intact.     Pupils: Pupils are equal, round, and reactive to light.  Cardiovascular:     Rate and Rhythm: Normal rate and regular rhythm.     Pulses: Normal pulses.     Heart sounds: Normal heart sounds.  Pulmonary:     Effort: Pulmonary effort is normal. No respiratory distress.     Breath sounds: Normal breath sounds. No stridor. No wheezing, rhonchi or rales.  Chest:     Chest wall: No tenderness.  Abdominal:     General: Abdomen is flat. There is no distension.     Palpations: Abdomen is soft.     Tenderness: There is no abdominal tenderness. There is no guarding or rebound.  Musculoskeletal:        General: No swelling or tenderness. Normal range of motion.     Cervical back: Normal range of motion and neck supple. No rigidity.     Right lower leg: No edema.     Left lower leg: No edema.  Skin:    General: Skin is warm and dry.     Capillary Refill: Capillary refill takes less than 2 seconds.     Coloration: Skin is not pale.  Neurological:     General: No focal deficit present.     Mental Status: She is alert and oriented to person, place, and time. Mental status is at baseline.     Cranial Nerves: No cranial nerve deficit, dysarthria or facial asymmetry.     Sensory: Sensation is intact. No sensory deficit.     Motor: Motor function is intact. No weakness, tremor, atrophy, abnormal muscle tone, seizure activity or pronator drift.     Coordination: Coordination is intact. Romberg sign negative. Finger-Nose-Finger Test normal.     Comments: Patient with completely normal neuro exam.  Sensation equal on upper extremities during my first neuro exam when daughter was not in the room.  When daughter entered the room I did another neuro exam and patient states that her sensation was decreased on the left side upper arm.  Normal strength of both sides.  No aphasia or slurred speech.   Coordination intact.  Bilateral lower extremities with normal strength.  Normal coordination with finger-to-nose.    Psychiatric:        Mood and Affect: Mood normal.        Behavior: Behavior normal.     ED Results / Procedures / Treatments   Labs (all labs ordered are listed, but only abnormal results are displayed) Labs Reviewed  CBC - Abnormal; Notable for the following components:      Result Value   RBC 3.83 (*)    All other components within normal limits  COMPREHENSIVE METABOLIC PANEL - Abnormal; Notable for the following components:   Creatinine, Ser 1.28 (*)  Calcium 8.8 (*)    Total Protein 6.1 (*)    GFR calc non Af Amer 40 (*)    GFR calc Af Amer 46 (*)    All other components within normal limits  CK - Abnormal; Notable for the following components:   Total CK 358 (*)    All other components within normal limits  I-STAT CHEM 8, ED - Abnormal; Notable for the following components:   Creatinine, Ser 1.20 (*)    Glucose, Bld 69 (*)    Calcium, Ion 1.06 (*)    All other components within normal limits  ETHANOL  PROTIME-INR  APTT  DIFFERENTIAL  RAPID URINE DRUG SCREEN, HOSP PERFORMED  URINALYSIS, ROUTINE W REFLEX MICROSCOPIC    EKG EKG Interpretation  Date/Time:  Monday May 18 2019 17:04:06 EDT Ventricular Rate:  79 PR Interval:    QRS Duration: 75 QT Interval:  345 QTC Calculation: 396 R Axis:   93 Text Interpretation: Sinus rhythm Anterior infarct, age indeterminate No significant change since last tracing Confirmed by Deno Etienne [phone]) on 05/18/2019 6:11:28 PM   Radiology CT Head Wo Contrast  Result Date: 05/18/2019 CLINICAL DATA:  Fall, head trauma EXAM: CT HEAD WITHOUT CONTRAST TECHNIQUE: Contiguous axial images were obtained from the base of the skull through the vertex without intravenous contrast. COMPARISON:  11/13/2016 FINDINGS: Brain: No acute intracranial abnormality. Specifically, no hemorrhage, hydrocephalus, mass lesion, acute  infarction, or significant intracranial injury. Vascular: No hyperdense vessel or unexpected calcification. Skull: No acute calvarial abnormality. Sinuses/Orbits: Visualized paranasal sinuses and mastoids clear. Orbital soft tissues unremarkable. Other: None IMPRESSION: Unremarkable study. Electronically Signed   By: Rolm Baptise M.D.   On: 05/18/2019 18:28   CT Cervical Spine Wo Contrast  Result Date: 05/18/2019 CLINICAL DATA:  Fall EXAM: CT CERVICAL SPINE WITHOUT CONTRAST TECHNIQUE: Multidetector CT imaging of the cervical spine was performed without intravenous contrast. Multiplanar CT image reconstructions were also generated. COMPARISON:  None. FINDINGS: Alignment: Normal alignment. Skull base and vertebrae: No acute fracture. No primary bone lesion or focal pathologic process. Soft tissues and spinal canal: No prevertebral fluid or swelling. No visible canal hematoma. 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 Degenerative disc and facet disease. Upper chest: Biapical scarring. 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 No acute bony abnormality. Electronically Signed   By: Rolm Baptise M.D.   On: 05/18/2019 18:29    Procedures Procedures (including critical care time)  Medications Ordered in ED Medications  sodium chloride 0.9 % bolus 1,000 mL (1,000 mLs Intravenous New Bag/Given 05/18/19 1854)  iohexol (OMNIPAQUE) 350 MG/ML injection 75 mL (60 mLs Intravenous Contrast Given 05/18/19 2018)    ED Course  I have reviewed the triage vital signs and the nursing notes.  Pertinent labs & imaging results that were available during my care of the patient were reviewed by me and considered in my medical decision making (see chart for details).    MDM Rules/Calculators/A&P                     DEBRIANA BUTKA is a 79 y.o. female with pertinent past medical history of high cholesterol that presents  emergency department today via EMS for fall.  Story concerning for TIA, will obtain stroke labs and CT of head and neck for fall.  Patient is not on blood thinner however does seem anticoagulated with the amount of Goody's powder she takes.  Patient  with normal neuro exam, will not activate code stroke right now.  Differential diagnoses considered include syncope due to vasovagal, orthostatic syncope, syncope due to cardiac causes, stroke, seizure.  Initial interventions IV fluids, patient not currently complaining of any pain.  Patient is clinically dry on exam.  CBC and CMP stable.  Creatinine of 1.28.  This could be due to dehydration.  CK of 358. Ct head and neck negative.   Marland Kitchen8:37 PM spoke to Dr. Cheral Marker from neurology who recommended CTA of head and neck, and additional MRI.  I will talk to hospitalist about admission due to new falls, weakness, dehydration and physical therapy.  8:37 PM Spoke to [facility] who will accept pt.   The patient appears reasonably stabilized for admission considering the current resources, flow, and capabilities available in the ED at this time, and I doubt any other [facility] requiring further screening and/or treatment in the ED prior to admission.  I discussed this case with my attending physician, Dr. Tyrone Nine, who cosigned this note including patient's presenting symptoms, physical exam, and planned diagnostics and interventions. Attending physician stated agreement with plan or made changes to plan which were implemented.   Attending physician assessed patient at bedside.  Final Clinical Impression(s) / ED Diagnoses Final diagnoses:  Weakness  Dehydration    Rx / DC Orders ED Discharge Orders    None       Alfredia Client, PA-C 05/19/19 [place], DO 05/19/19 1354

## 2019-05-19 ENCOUNTER — Inpatient Hospital Stay (HOSPITAL_COMMUNITY): Payer: Medicare HMO

## 2019-05-19 DIAGNOSIS — I639 Cerebral infarction, unspecified: Secondary | ICD-10-CM

## 2019-05-19 DIAGNOSIS — I6389 Other cerebral infarction: Secondary | ICD-10-CM

## 2019-05-19 DIAGNOSIS — W19XXXA Unspecified fall, initial encounter: Secondary | ICD-10-CM

## 2019-05-19 DIAGNOSIS — I951 Orthostatic hypotension: Secondary | ICD-10-CM

## 2019-05-19 LAB — COMPREHENSIVE METABOLIC PANEL
ALT: 11 U/L (ref 0–44)
AST: 34 U/L (ref 15–41)
Albumin: 3.1 g/dL — ABNORMAL LOW (ref 3.5–5.0)
Alkaline Phosphatase: 35 U/L — ABNORMAL LOW (ref 38–126)
Anion gap: 9 (ref 5–15)
BUN: 18 mg/dL (ref 8–23)
CO2: 24 mmol/L (ref 22–32)
Calcium: 7.8 mg/dL — ABNORMAL LOW (ref 8.9–10.3)
Chloride: 111 mmol/L (ref 98–111)
Creatinine, Ser: 1.08 mg/dL — ABNORMAL HIGH (ref 0.44–1.00)
GFR calc Af Amer: 57 mL/min — ABNORMAL LOW (ref >60)
GFR calc non Af Amer: 49 mL/min — ABNORMAL LOW (ref >60)
Glucose, Bld: 79 mg/dL (ref 70–99)
Potassium: 3.9 mmol/L (ref 3.5–5.1)
Sodium: 144 mmol/L (ref 135–145)
Total Bilirubin: 0.4 mg/dL (ref 0.3–1.2)
Total Protein: 5.4 g/dL — ABNORMAL LOW (ref 6.5–8.1)

## 2019-05-19 LAB — GLUCOSE, CAPILLARY
Glucose-Capillary: 77 mg/dL (ref 70–99)
Glucose-Capillary: 70 mg/dL (ref 70–99)
Glucose-Capillary: 65 mg/dL — ABNORMAL LOW (ref 70–99)
Glucose-Capillary: 123 mg/dL — ABNORMAL HIGH (ref 70–99)
Glucose-Capillary: 98 mg/dL (ref 70–99)
Glucose-Capillary: 91 mg/dL (ref 70–99)
Glucose-Capillary: 76 mg/dL (ref 70–99)
Glucose-Capillary: 111 mg/dL — ABNORMAL HIGH (ref 70–99)
Glucose-Capillary: 81 mg/dL (ref 70–99)
Glucose-Capillary: 70 mg/dL (ref 70–99)

## 2019-05-19 LAB — PROCALCITONIN: Procalcitonin: <0.1 ng/mL

## 2019-05-19 LAB — CBC WITH DIFFERENTIAL/PLATELET
Abs Immature Granulocytes: 0.02 10*3/uL (ref 0.00–0.07)
Basophils Absolute: 0.1 10*3/uL (ref 0.0–0.1)
Basophils Relative: 1 %
Eosinophils Absolute: 0.1 10*3/uL (ref 0.0–0.5)
Eosinophils Relative: 1 %
HCT: 35.7 % — ABNORMAL LOW (ref 36.0–46.0)
Hemoglobin: 11.4 g/dL — ABNORMAL LOW (ref 12.0–15.0)
Immature Granulocytes: 0 %
Lymphocytes Relative: 17 %
Lymphs Abs: 1.1 10*3/uL (ref 0.7–4.0)
MCH: 32.3 pg (ref 26.0–34.0)
MCHC: 31.9 g/dL (ref 30.0–36.0)
MCV: 101.1 fL — ABNORMAL HIGH (ref 80.0–100.0)
Monocytes Absolute: 0.5 10*3/uL (ref 0.1–1.0)
Monocytes Relative: 8 %
Neutro Abs: 4.5 10*3/uL (ref 1.7–7.7)
Neutrophils Relative %: 73 %
Platelets: 165 10*3/uL (ref 150–400)
RBC: 3.53 MIL/uL — ABNORMAL LOW (ref 3.87–5.11)
RDW: 13.3 % (ref 11.5–15.5)
WBC: 6.3 10*3/uL (ref 4.0–10.5)
nRBC: 0 % (ref 0.0–0.2)

## 2019-05-19 LAB — CORTISOL: Cortisol, Plasma: 20 ug/dL

## 2019-05-19 LAB — ECHOCARDIOGRAM COMPLETE
Height: 61 in
Weight: 1568 oz

## 2019-05-19 LAB — CBG MONITORING, ED
Glucose-Capillary: 108 mg/dL — ABNORMAL HIGH (ref 70–99)
Glucose-Capillary: 143 mg/dL — ABNORMAL HIGH (ref 70–99)
Glucose-Capillary: 59 mg/dL — ABNORMAL LOW (ref 70–99)
Glucose-Capillary: 93 mg/dL (ref 70–99)

## 2019-05-19 LAB — LACTIC ACID, PLASMA
Lactic Acid, Venous: 0.5 mmol/L (ref 0.5–1.9)
Lactic Acid, Venous: 0.4 mmol/L — ABNORMAL LOW (ref 0.5–1.9)

## 2019-05-19 LAB — CK: Total CK: 683 U/L — ABNORMAL HIGH (ref 38–234)

## 2019-05-19 LAB — TSH: TSH: 2.886 u[IU]/mL (ref 0.350–4.500)

## 2019-05-19 LAB — TROPONIN I (HIGH SENSITIVITY): Troponin I (High Sensitivity): 18 ng/L — ABNORMAL HIGH (ref <18)

## 2019-05-19 LAB — SARS CORONAVIRUS 2 BY RT PCR (HOSPITAL ORDER, PERFORMED IN ~~LOC~~ HOSPITAL LAB): SARS Coronavirus 2: NEGATIVE

## 2019-05-19 MED ORDER — ROSUVASTATIN CALCIUM 5 MG PO TABS
10.0000 mg | ORAL_TABLET | Freq: Every day | ORAL | Status: DC
  Administered 2019-05-19 – 2019-05-20 (×2): 10 mg via ORAL
  Filled 2019-05-19 (×2): qty 2

## 2019-05-19 MED ORDER — ADULT MULTIVITAMIN W/MINERALS CH
1.0000 | ORAL_TABLET | Freq: Every day | ORAL | Status: DC
  Administered 2019-05-19 – 2019-05-21 (×3): 1 via ORAL
  Filled 2019-05-19 (×3): qty 1

## 2019-05-19 MED ORDER — MIDODRINE HCL 5 MG PO TABS
5.0000 mg | ORAL_TABLET | Freq: Two times a day (BID) | ORAL | Status: DC
  Administered 2019-05-19 – 2019-05-20 (×2): 5 mg via ORAL
  Filled 2019-05-19 (×2): qty 1

## 2019-05-19 MED ORDER — DEXTROSE 50 % IV SOLN
1.0000 | Freq: Once | INTRAVENOUS | Status: AC
  Administered 2019-05-19: 50 mL via INTRAVENOUS

## 2019-05-19 MED ORDER — SODIUM CHLORIDE 0.9 % IV SOLN: INTRAVENOUS | Status: AC

## 2019-05-19 MED ORDER — RESOURCE THICKENUP CLEAR PO POWD
ORAL | Status: DC | PRN
  Filled 2019-05-19: qty 125

## 2019-05-19 MED ORDER — ASPIRIN EC 325 MG PO TBEC
325.0000 mg | DELAYED_RELEASE_TABLET | Freq: Every day | ORAL | Status: DC
  Administered 2019-05-19 – 2019-05-21 (×3): 325 mg via ORAL
  Filled 2019-05-19 (×3): qty 1

## 2019-05-19 MED ORDER — SODIUM CHLORIDE 0.9 % IV BOLUS
250.0000 mL | Freq: Once | INTRAVENOUS | Status: AC
  Administered 2019-05-19: 250 mL via INTRAVENOUS

## 2019-05-19 MED ORDER — SODIUM CHLORIDE 0.9 % IV BOLUS
500.0000 mL | Freq: Once | INTRAVENOUS | Status: AC
  Administered 2019-05-19: 500 mL via INTRAVENOUS

## 2019-05-19 MED ORDER — DEXTROSE 50 % IV SOLN
INTRAVENOUS | Status: AC
  Filled 2019-05-19: qty 50

## 2019-05-19 NOTE — Evaluation (Signed)
Physical Therapy Evaluation Patient Details Name: Sarah Rojas MRN: [id number] DOB: [birth year] Today's Date: 05/19/2019   History of Present Illness  Sarah Rojas is a 79 y.o. female with history of tobacco abuse and possible COPD had 2 syncopal episodes in 24 hours.  After second one pt's daughter found her on the floor with right sided facial droop along with some slurred speech and left-sided weakness.  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   Clinical Impression  Pt admitted with above diagnosis. Pt presents with LLE weakness and balance impairment as well as decreased insight into deficits making her very unsafe. Required min A for all mobility and was less safe with RW than with HHA due to running into obstacles. On RA, pt desat to 83% with ambulation, return to 91% on 2L. Pt states that her daughter can stay with her 24/7.  Pt currently with functional limitations due to the deficits listed below (see PT Problem List). Pt will benefit from skilled PT to increase their independence and safety with mobility to allow discharge to the venue listed below.       Follow Up Recommendations Home health PT;Supervision/Assistance - 24 hour    Equipment Recommendations  None recommended by PT    Recommendations for Other Services OT consult     Precautions / Restrictions Precautions Precautions: Fall Restrictions Weight Bearing Restrictions: No      Mobility  Bed Mobility Overal bed mobility: Needs Assistance Bed Mobility: Supine to Sit;Sit to Supine     Supine to sit: Min assist Sit to supine: Supervision   General bed mobility comments: pt needed tactile cues to initiate move to EOB. She was able to sit straight up into long sitting but could not problem solve coming to EOB. Min A to LLE and then min A to hips to scoot to edge. Pt used momentum to get LE's into bed with return to supine. Pt had difficulty bridging L hip in supine  Transfers Overall  transfer level: Needs assistance Equipment used: Rolling walker (2 wheeled);None Transfers: Sit to/from Stand Sit to Stand: Min assist         General transfer comment: min A to steady from bed and toilet  Ambulation/Gait Ambulation/Gait assistance: Min assist Gait Distance (Feet): 20 Feet Assistive device: Rolling walker (2 wheeled);None Gait Pattern/deviations: Narrow base of support;Staggering left;Staggering right;Step-through pattern;Decreased stride length Gait velocity: decreased Gait velocity interpretation: <1.8 ft/sec, indicate of risk for recurrent falls General Gait Details: pt very unsteady with ambulation without AD but when given RW, kept running into obstacles and was just as unsafe. Min A for balance   Stairs            Wheelchair Mobility    Modified Rankin (Stroke Patients Only) Modified Rankin (Stroke Patients Only) Pre-Morbid Rankin Score: No significant disability Modified Rankin: Moderately severe disability     Balance Overall balance assessment: Needs assistance Sitting-balance support: Feet supported;No upper extremity supported Sitting balance-Leahy Scale: Fair     Standing balance support: No upper extremity supported Standing balance-Leahy Scale: Poor Standing balance comment: needs UE support for safety                             Pertinent Vitals/Pain Pain Assessment: Faces Faces Pain Scale: Hurts little more Pain Location: sore all over Pain Descriptors / Indicators: Grimacing;Sore Pain Intervention(s): Limited activity within patient's tolerance;Monitored during session    Home Living Family/patient  expects to be discharged to:: Private residence Living Arrangements: Alone Available Help at Discharge: Family;Available 24 hours/day Type of Home: House         Home Equipment: Walker - 2 wheels;Cane - single point;Bedside commode Additional Comments: pt poor historian, cannot give accurate description of home at  this time. Pt reports that daughter can come stay with her 24/7 but will need to verify. Family left as PT eval began    Prior Function Level of Independence: Independent         Comments: pt reports she was independent without AD and was driving and doing her own shopping but per chart pt was basically "shut in" since husband passed in 2017     Hand Dominance        Extremity/Trunk Assessment   Upper Extremity Assessment Upper Extremity Assessment: Defer to OT evaluation    Lower Extremity Assessment Lower Extremity Assessment: LLE deficits/detail LLE Deficits / Details: pt denies that LLE is weak at first but then admitted she could not move it as well as R which was grossly [facility]. L hip flex 2/5, knee ext 3/5 LLE Sensation: decreased proprioception LLE Coordination: decreased gross motor    Cervical / Trunk Assessment Cervical / Trunk Assessment: Kyphotic  Communication   Communication: Expressive difficulties(mild slurring)  Cognition Arousal/Alertness: Awake/alert Behavior During Therapy: Flat affect Overall Cognitive Status: Impaired/Different from baseline Area of Impairment: Memory;Safety/judgement;Problem solving                     Memory: Decreased short-term memory   Safety/Judgement: Decreased awareness of deficits;Decreased awareness of safety   Problem Solving: Difficulty sequencing;Requires verbal cues;Slow processing;Requires tactile cues;Decreased initiation General Comments: pt needed increased time to follow commands, became confused with instructional cues and needed tactile cues. Decreased awareness of deficits. Decreased awareness of IV and lines. Difficulty with obstacle navigation.       General Comments General comments (skin integrity, edema, etc.): SpO2 down to 83% on RA with ambulation. Returned to 91% on 2L. Pt did not wear O2 at home but reports she had albuterol.     Exercises     Assessment/Plan    PT Assessment Patient  needs continued PT services  PT Problem List Decreased strength;Decreased activity tolerance;Decreased balance;Decreased mobility;Decreased coordination;Decreased cognition;Decreased knowledge of use of DME;Decreased safety awareness;Decreased knowledge of precautions;Cardiopulmonary status limiting activity;Pain;Decreased range of motion       PT Treatment Interventions DME instruction;Gait training;Stair training;Functional mobility training;Therapeutic activities;Therapeutic exercise;Balance training;Patient/family education;Cognitive remediation    PT Goals (Current goals can be found in the Care Plan section)  Acute Rehab PT Goals Patient Stated Goal: go home PT Goal Formulation: With patient Time For Goal Achievement: 06/02/19 Potential to Achieve Goals: Good    Frequency Min 4X/week   Barriers to discharge Decreased caregiver support lives alone but states that family can stay with her 24/7    Co-evaluation               AM-PAC PT "6 Clicks" Mobility  Outcome Measure Help needed turning from your back to your side while in a flat bed without using bedrails?: A Little Help needed moving from lying on your back to sitting on the side of a flat bed without using bedrails?: A Little Help needed moving to and from a bed to a chair (including a wheelchair)?: A Little Help needed standing up from a chair using your arms (e.g., wheelchair or bedside chair)?: A Little Help needed to walk in hospital  room?: A Lot Help needed climbing 3-5 steps with a railing? : Total 6 Click Score: 15    End of Session Equipment Utilized During Treatment: Gait belt;Oxygen Activity Tolerance: Patient tolerated treatment well Patient left: in bed;with call bell/phone within reach;with bed alarm set Nurse Communication: Mobility status;Other (comment)(O2) PT Visit Diagnosis: Unsteadiness on feet (R26.81);Repeated falls (R29.6);Difficulty in walking, not elsewhere classified (R26.2);Pain Pain -  Right/Left: (generalized) Pain - part of body: (generalized)    Time: [id number] PT Time Calculation (min) (ACUTE ONLY): 30 min   Charges:   PT Evaluation $PT Eval Moderate Complexity: 1 Mod PT Treatments $Gait Training: 8-22 mins        [name], PT  Acute Rehab Services  Pager [phone] [place] 05/19/2019, 4:40 PM

## 2019-05-19 NOTE — Evaluation (Signed)
Clinical/Bedside Swallow Evaluation Patient Details  Name: Sarah Rojas MRN: [id number] Date of Birth: 09-24-1940  Today's Date: 05/19/2019 Time: SLP Start Time (ACUTE ONLY): 0755 SLP Stop Time (ACUTE ONLY): 0820 SLP Time Calculation (min) (ACUTE ONLY): 25 min  Past Medical History:  Past Medical History:  Diagnosis Date  . High cholesterol    Past Surgical History:  Past Surgical History:  Procedure Laterality Date  . ABDOMINAL HYSTERECTOMY     HPI:  79 yo female adm to [facility] with fall and head trauma, right facial droop and ? change to left arm sensation/strength.  Pt PMH + for smoking, prior cervical fusion C5-C6, bacterial pna possibly.  Pt imaging showed decrease diffusion posterior right corona radiata, cervical spine ankylosis with C4-C5, degenerative spinal stenosis.  Pt did not pass [facility] and her CXR showed emphysema.  Swallow/speech evaluation ordered.   Assessment / Plan / Recommendation Clinical Impression  Patient presents with symptoms of at minimum oral dysphagia due to facial/trigeminal and hypoglossal nerve involvement resulting in decreased right labial seal, impaired oral bolus manipulation with delayed oral transiting presumed.    Pt was not able to form suction on her straw therefore advise against straw use.  Pharyngeal concerns present evidenced by pt cough immediately post-swallow of orange juice - possibly due to premature spillage and/or pharyngeal delay in swallow reflex.  No indication of aspiration with nectar, puree or solids.  Excessively prolonged mastication without independent clearance - requiring puree to transit.  Pt reports sensation of residual at pharynx but then pointed to mid-sternum region when asked for location.  Pt intake consisted of six ounces orange juice, 3 boluses of applesauce, and single bite of cracker- no indication of aspiration or severe concern for gross pharyngeal retention.  Suspect possibly dysmotility as pt reports she sometimes  has to "get up and walk around" while eating if not clearing well.    Pt's current mentation further impairs her swallowing therefore recommend modify diet to full liquids/nectar thick and SlP to follow up next date to determine readiness/indication for instrumental swallow evaluation.  Pt and RN educated to recommendations/findings and orders placed.   SLP Visit Diagnosis: Dysphagia, oropharyngeal phase (R13.12)    Aspiration Risk  Mild aspiration risk    Diet Recommendation Nectar-thick liquid(full liquids)   Liquid Administration via: Cup;Spoon;No straw Medication Administration: Whole meds with puree Supervision: Full supervision/cueing for compensatory strategies Compensations: Small sips/bites;Minimize environmental distractions;Slow rate(allow time for reflexive dry swallows as needed) Postural Changes: Seated upright at 90 degrees;Remain upright for at least 30 minutes after po intake    Other  Recommendations Oral Care Recommendations: Oral care BID Other Recommendations: Order thickener from pharmacy   Follow up Recommendations 24 hour supervision/assistance      Frequency and Duration min 2x/week  1 week       Prognosis Prognosis for Safe Diet Advancement: Fair Barriers to Reach Goals: Cognitive deficits      Swallow Study   General Date of Onset: 05/19/19 HPI: 79 yo female adm to [facility] with fall and head trauma, right facial droop and ? change to left arm sensation/strength.  Pt PMH + for smoking, prior cervical fusion C5-C6, bacterial pna possibly.  Pt imaging showed decrease diffusion posterior right corona radiata, cervical spine ankylosis with C4-C5, degenerative spinal stenosis.  Pt did not pass [facility] and her CXR showed emphysema.  Swallow/speech evaluation ordered. Type of Study: Bedside Swallow Evaluation Diet Prior to this Study: NPO Temperature Spikes Noted: No Respiratory Status: Nasal cannula History  of Recent Intubation: No Behavior/Cognition:  Lethargic/Drowsy Oral Cavity Assessment: Dry Oral Care Completed by SLP: Yes Oral Cavity - Dentition: Dentures, bottom(upper dentures not in hospital) Vision: Impaired for self-feeding(hand over hand assist due to weakness) Patient Positioning: Upright in bed Baseline Vocal Quality: Low vocal intensity Volitional Cough: Weak Volitional Swallow: Able to elicit    Oral/Motor/Sensory Function Overall Oral Motor/Sensory Function: Mild impairment Facial ROM: Reduced right Facial Symmetry: Abnormal symmetry right Lingual ROM: Reduced right Lingual Symmetry: Abnormal symmetry right Lingual Strength: Reduced Velum: Other (comment)(difficult to view, appeared sluggish)   Ice Chips Ice chips: Not tested Other Comments: pt does not tolerate ice chips   Thin Liquid Thin Liquid: Impaired Presentation: Straw;Cup Oral Phase Impairments: Reduced labial seal;Poor awareness of bolus;Reduced lingual movement/coordination Oral Phase Functional Implications: Prolonged oral transit Pharyngeal  Phase Impairments: Suspected delayed Swallow;Multiple swallows;Cough - Immediate    Nectar Thick Nectar Thick Liquid: Impaired Presentation: Cup;Straw;Spoon;Self Fed Oral Phase Impairments: Reduced lingual movement/coordination;Reduced labial seal Oral phase functional implications: Prolonged oral transit Pharyngeal Phase Impairments: Suspected delayed Swallow;Multiple swallows   Honey Thick Honey Thick Liquid: Not tested   Puree Puree: Impaired Presentation: Spoon Oral Phase Impairments: Reduced labial seal;Reduced lingual movement/coordination Oral Phase Functional Implications: Prolonged oral transit Pharyngeal Phase Impairments: Suspected delayed Swallow   Solid     Solid: Impaired Oral Phase Impairments: Reduced labial seal;Reduced lingual movement/coordination;Impaired mastication Oral Phase Functional Implications: Prolonged oral transit;Oral residue Pharyngeal Phase Impairments: Suspected delayed  Swallow Other Comments: use of puree required to aid oral transiting      Macario Golds 05/19/2019,9:36 AM  Kathleen Lime, MS [place] Office [phone]

## 2019-05-19 NOTE — Progress Notes (Signed)
Echocardiogram 2D Echocardiogram has been performed.  Oneal Deputy Nadiya Pieratt 05/19/2019, 10:11 AM

## 2019-05-19 NOTE — Progress Notes (Addendum)
Patient seen and examined, admitted earlier this morning by Dr. Hal Hope, briefly this is a 79 year old female with history of longstanding tobacco abuse and COPD was brought to the emergency room with complaints of loss of consciousness, fall and right facial droop with slurred speech. -She lives independently, has a daughter who lives close by and checks on her frequently, patient reportedly fell on Sunday and Monday 5/17, history is corroborated by daughter, patient now reports that she may have lost consciousness during that episode on Sunday. -On Monday 5/17 when her daughter called her she sounded slurred and weak, and reportedly had another syncopal event that day. -Work-up in the ED noted mild lethargy, hypoglycemia and hypotension with blood pressure in the 90s, labs were unremarkable otherwise, chest x-ray noted some nonspecific findings, MRI brain showed an area of restricted diffusion concerning for CVA versus artifact  Syncope Hypoglycemia and hypotension -MRI findings consistent with artifact and not acute stroke per neurology -Neurological symptoms may have been secondary to hypoglycemia and hypotension -Nonfocal exam -Cause of hypotension and hypoglycemia is unclear, recurrent hypoglycemia on admission, appears to have resolved now, blood pressure still in the 90s, will give a saline bolus x1 now and start normal saline at 100 mL an hour for 24 hours. -Also trial of low-dose midodrine -Check 2D echocardiogram -For hypoglycemia, repeat CBGs are improving, follow-up C-peptide, proinsulin and insulin levels -PT OT eval -Monitor on telemetry  COPD Heavy tobacco abuse -Counseled, no wheezing at this time, monitor  Lung nodules -Needs follow-up CT chest in 6 months  Atherosclerotic arterial disease -Involving aorta and potentially coronaries, noted on CT chest -Add aspirin at discharge, consider statin -Smoking cessation  Dysphagia -SLP following, thickened liq diet for now,  advance as tol  Domenic Polite, MD

## 2019-05-19 NOTE — Progress Notes (Addendum)
STROKE TEAM PROGRESS NOTE   INTERVAL HISTORY Daughter and son are at bedside.  Patient initially sleeping, but easily arousable, orientated, answer question appropriately.  She knows she had a fall twice but does not know why she was falling, denies any prodromal symptoms like dizziness or chest pain.  However, the first fall was after she got up to go to bathroom.  Second fall was she went to mailbox and come back fell at home.  As per daughter and son, patient does have also static hypotension well checked by EMS.  Currently patient BP still 90s on repeated checking, on IV fluid.  May consider midodrine or Florinef, and TED hose.  Vitals:   05/19/19 0504 05/19/19 0732 05/19/19 0738 05/19/19 1116  BP: (!) 97/44 (!) 96/45  (!) 91/50  Pulse: 67 74 69 61  Resp: 15  18 16   Temp: 98.9 F (37.2 C)   98.2 F (36.8 C)  TempSrc:    Oral  SpO2: 100%   97%  Weight:      Height:        CBC:  Recent Labs  Lab 05/18/19 1738 05/18/19 1756 05/18/19 2235 05/19/19 0527  WBC 7.7   < > 6.1 6.3  NEUTROABS 5.7  --   --  4.5  HGB 12.3   < > 12.5 11.4*  HCT 38.3   < > 39.8 35.7*  MCV 100.0   < > 101.5* 101.1*  PLT 163   < > 177 165   < > = values in this interval not displayed.    Basic Metabolic Panel:  Recent Labs  Lab 05/18/19 1738 05/18/19 1738 05/18/19 1756 05/18/19 1756 05/18/19 2235 05/19/19 0527  NA 140   < > 140  --   --  144  K 4.0   < > 3.9  --   --  3.9  CL 107   < > 109  --   --  111  CO2 22  --   --   --   --  24  GLUCOSE 77   < > 69*  --   --  79  BUN 20   < > 23  --   --  18  CREATININE 1.28*   < > 1.20*   < > 1.22* 1.08*  CALCIUM 8.8*  --   --   --   --  7.8*   < > = values in this interval not displayed.   Lipid Panel:     Component Value Date/Time   CHOL 157 05/18/2019 2236   TRIG 76 05/18/2019 2236   HDL 57 05/18/2019 2236   CHOLHDL 2.8 05/18/2019 2236   VLDL 15 05/18/2019 2236   LDLCALC 85 05/18/2019 2236   HgbA1c: No results found for: HGBA1C Urine Drug  Screen: No results found for: LABOPIA, COCAINSCRNUR, LABBENZ, AMPHETMU, THCU, LABBARB  Alcohol Level     Component Value Date/Time   ETH <10 05/18/2019 1738    IMAGING past 24 hours CT Angio Head W/Cm &/Or Wo Cm  Result Date: 05/18/2019 CLINICAL DATA:  Initial evaluation for acute speech difficulty, decreased sensation in left upper extremity. EXAM: CT ANGIOGRAPHY HEAD AND NECK TECHNIQUE: Multidetector CT imaging of the head and neck was performed using the standard protocol during bolus administration of intravenous contrast. Multiplanar CT image reconstructions and MIPs were obtained to evaluate the vascular anatomy. Carotid stenosis measurements (when applicable) are obtained utilizing NASCET criteria, using the distal internal carotid diameter as the denominator. CONTRAST:  2mL OMNIPAQUE IOHEXOL 350 MG/ML SOLN COMPARISON:  Prior head CT from earlier the same day. FINDINGS: CTA NECK FINDINGS Aortic arch: Visualized aortic arch of normal caliber with normal branch pattern. Moderate to advanced atheromatous change seen about the aortic arch and origin of the great vessels short-segment approximate 65% stenosis noted at the origin of the left subclavian artery (series 8, image 311). Additional extensive atheromatous change throughout the visualized subclavian arteries without high-grade stenosis. No other hemodynamically significant stenosis seen about the origin of the great vessels. Right carotid system: Scattered atheromatous change throughout the right common and internal carotid arteries without hemodynamically significant stenosis. Mild atheromatous change about the right carotid bifurcation without significant stenosis. Left carotid system: Scattered atheromatous change throughout the left CCA without high-grade stenosis. Scattered eccentric calcified plaque about the left bifurcation/proximal left ICA without significant stenosis. Left ICA widely patent distally to the skull base without stenosis,  dissection or occlusion. Vertebral arteries: Both vertebral arteries arise from the subclavian arteries. Vertebral arteries widely patent without stenosis, dissection or occlusion. Skeleton: No acute osseous abnormality. No discrete or worrisome osseous lesions. 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 Other neck: No other acute soft tissue abnormality within the neck. No mass lesion or adenopathy. Upper chest: Extensive paraseptal and centrilobular emphysematous changes noted within the visualized lungs. Associated irregular biapical pleuroparenchymal thickening and/or scarring. Visualized upper chest demonstrates no other acute finding. Review of the MIP images confirms the above findings CTA HEAD FINDINGS Anterior circulation: Petrous segments widely patent bilaterally. Scattered atheromatous plaque within the cavernous/supraclinoid ICAs without hemodynamically significant stenosis. A1 segments widely patent. Right A1 hypoplastic, accounting for the slightly diminutive right ICA is compared to the left. Normal anterior communicating artery complex. Anterior cerebral arteries widely patent to their distal aspects without stenosis. No M1 stenosis or occlusion. Normal MCA bifurcations. Distal MCA branches well perfused and symmetric. Posterior circulation: Both vertebral arteries widely patent to the vertebrobasilar junction without stenosis. Both picas patent. Basilar widely patent to its distal aspect without stenosis. Superior cerebral arteries patent bilaterally. Right PCA primarily supplied via the basilar. Left PCA supplied via a hypoplastic left P1 segment and robust left posterior communicating artery. Both PCAs well perfused to their distal aspects without stenosis. Venous sinuses: Patent. Anatomic variants: Hypoplastic right A1 and left P1 segments. No intracranial aneurysm or other vascular abnormality. Review of the MIP images confirms the above findings IMPRESSION: 1. Negative  CTA for large vessel occlusion. 2. Moderate atheromatous change about the major arterial vasculature of the head and neck, most pronounced at the aortic arch. Associated short-segment 65% stenosis at the origin of the left subclavian artery. No other hemodynamically significant or correctable stenosis identified. 3.  Emphysema (ICD10-J43.9). Electronically Signed   By: Jeannine Boga M.D.   On: 05/18/2019 21:01   CT Head Wo Contrast  Result Date: 05/18/2019 CLINICAL DATA:  Fall, head trauma EXAM: CT HEAD WITHOUT CONTRAST TECHNIQUE: Contiguous axial images were obtained from the base of the skull through the vertex without intravenous contrast. COMPARISON:  11/13/2016 FINDINGS: Brain: No acute intracranial abnormality. Specifically, no hemorrhage, hydrocephalus, mass lesion, acute infarction, or significant intracranial injury. Vascular: No hyperdense vessel or unexpected calcification. Skull: No acute calvarial abnormality. Sinuses/Orbits: Visualized paranasal sinuses and mastoids clear. Orbital soft tissues unremarkable. Other: None IMPRESSION: Unremarkable study. Electronically Signed   By: Rolm Baptise M.D.   On: 05/18/2019 18:28   CT Angio Neck W and/or Wo Contrast  Result Date: 05/18/2019 CLINICAL DATA:  Initial  evaluation for acute speech difficulty, decreased sensation in left upper extremity. EXAM: CT ANGIOGRAPHY HEAD AND NECK TECHNIQUE: Multidetector CT imaging of the head and neck was performed using the standard protocol during bolus administration of intravenous contrast. Multiplanar CT image reconstructions and MIPs were obtained to evaluate the vascular anatomy. Carotid stenosis measurements (when applicable) are obtained utilizing NASCET criteria, using the distal internal carotid diameter as the denominator. CONTRAST:  79mL OMNIPAQUE IOHEXOL 350 MG/ML SOLN COMPARISON:  Prior head CT from earlier the same day. FINDINGS: CTA NECK FINDINGS Aortic arch: Visualized aortic arch of normal  caliber with normal branch pattern. Moderate to advanced atheromatous change seen about the aortic arch and origin of the great vessels short-segment approximate 65% stenosis noted at the origin of the left subclavian artery (series 8, image 311). Additional extensive atheromatous change throughout the visualized subclavian arteries without high-grade stenosis. No other hemodynamically significant stenosis seen about the origin of the great vessels. Right carotid system: Scattered atheromatous change throughout the right common and internal carotid arteries without hemodynamically significant stenosis. Mild atheromatous change about the right carotid bifurcation without significant stenosis. Left carotid system: Scattered atheromatous change throughout the left CCA without high-grade stenosis. Scattered eccentric calcified plaque about the left bifurcation/proximal left ICA without significant stenosis. Left ICA widely patent distally to the skull base without stenosis, dissection or occlusion. Vertebral arteries: Both vertebral arteries arise from the subclavian arteries. Vertebral arteries widely patent without stenosis, dissection or occlusion. Skeleton: No acute osseous abnormality. No discrete or worrisome osseous lesions. 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 Other neck: No other acute soft tissue abnormality within the neck. No mass lesion or adenopathy. Upper chest: Extensive paraseptal and centrilobular emphysematous changes noted within the visualized lungs. Associated irregular biapical pleuroparenchymal thickening and/or scarring. Visualized upper chest demonstrates no other acute finding. Review of the MIP images confirms the above findings CTA HEAD FINDINGS Anterior circulation: Petrous segments widely patent bilaterally. Scattered atheromatous plaque within the cavernous/supraclinoid ICAs without hemodynamically significant stenosis. A1 segments widely patent. Right A1  hypoplastic, accounting for the slightly diminutive right ICA is compared to the left. Normal anterior communicating artery complex. Anterior cerebral arteries widely patent to their distal aspects without stenosis. No M1 stenosis or occlusion. Normal MCA bifurcations. Distal MCA branches well perfused and symmetric. Posterior circulation: Both vertebral arteries widely patent to the vertebrobasilar junction without stenosis. Both picas patent. Basilar widely patent to its distal aspect without stenosis. Superior cerebral arteries patent bilaterally. Right PCA primarily supplied via the basilar. Left PCA supplied via a hypoplastic left P1 segment and robust left posterior communicating artery. Both PCAs well perfused to their distal aspects without stenosis. Venous sinuses: Patent. Anatomic variants: Hypoplastic right A1 and left P1 segments. No intracranial aneurysm or other vascular abnormality. Review of the MIP images confirms the above findings IMPRESSION: 1. Negative CTA for large vessel occlusion. 2. Moderate atheromatous change about the major arterial vasculature of the head and neck, most pronounced at the aortic arch. Associated short-segment 65% stenosis at the origin of the left subclavian artery. No other hemodynamically significant or correctable stenosis identified. 3.  Emphysema (ICD10-J43.9). Electronically Signed   By: Jeannine Boga M.D.   On: 05/18/2019 21:01   CT CHEST WO CONTRAST  Result Date: 05/19/2019 CLINICAL DATA:  Followup abnormal chest x-ray. Shortness of breath. EXAM: CT CHEST WITHOUT CONTRAST TECHNIQUE: Multidetector CT imaging of the chest was performed following the standard protocol without IV contrast. COMPARISON:  Chest x-ray 05/19/2019 and  chest CT from 2006 FINDINGS: Cardiovascular: The heart is normal in size for age. No pericardial effusion. Severe/advanced atherosclerotic calcifications involving the thoracic aorta, branch vessels and extensive three-vessel  coronary artery calcifications. No aortic aneurysm. Mediastinum/Nodes: Scattered sub 8 mm mediastinal and hilar lymph nodes. No mass or overt adenopathy. Slightly prominent pulmonary arteries suggesting pulmonary hypertension. The esophagus is grossly normal. Lungs/Pleura: Significant biapical pleural and parenchymal scarring changes. Underlying emphysema. Dense areas of linear scarring type changes involving both upper lobes medially extending to the medial pleura in the lower aspect of the chest. No acute infiltrates or pulmonary edema. Very small pleural effusions and overlying atelectasis. Numerous small basilar pulmonary nodules are likely benign. Upper Abdomen: No significant upper abdominal findings. Some streaky residual parenchymal contrast is noted in the kidneys from a contrast enhanced CT of the neck. Recommend correlation with renal function studies as this can be seen with acute tubular necrosis. Advanced vascular calcifications. Musculoskeletal: No significant bony findings. IMPRESSION: 1. Significant biapical pleural and parenchymal scarring changes. 2. Dense areas of linear scarring type changes involving both upper lobes medially extending to the medial pleura in the lower aspect of the chest. 3. Very small pleural effusions and overlying atelectasis and mild dependent edema. 4. Numerous small basilar pulmonary nodules are likely benign. Recommend follow-up noncontrast chest CT in 6 months. 5. Streaky parenchymal residual contrast in the kidneys can be seen with ATN. Recommend correlation with renal function. 6. Advanced atherosclerotic calcifications involving the thoracic aorta and branch vessels including the coronary arteries. 7. Emphysema and aortic atherosclerosis. Aortic Atherosclerosis (ICD10-I70.0) and Emphysema (ICD10-J43.9). Aortic Atherosclerosis (ICD10-I70.0) and Emphysema (ICD10-J43.9). Electronically Signed   By: Marijo Sanes M.D.   On: 05/19/2019 09:38   CT Cervical Spine Wo  Contrast  Result Date: 05/18/2019 CLINICAL DATA:  Fall EXAM: CT CERVICAL SPINE WITHOUT CONTRAST TECHNIQUE: Multidetector CT imaging of the cervical spine was performed without intravenous contrast. Multiplanar CT image reconstructions were also generated. COMPARISON:  None. FINDINGS: Alignment: Normal alignment. Skull base and vertebrae: No acute fracture. No primary bone lesion or focal pathologic process. Soft tissues and spinal canal: No prevertebral fluid or swelling. No visible canal hematoma. 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 Degenerative disc and facet disease. Upper chest: Biapical scarring. 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 No acute bony abnormality. Electronically Signed   By: Rolm Baptise M.D.   On: 05/18/2019 18:29   MR Brain Wo Contrast (neuro protocol)  Result Date: 05/18/2019 CLINICAL DATA:  79 year old female status post fall with head trauma, abnormal speech and left arm sensation subsequently. EXAM: MRI HEAD WITHOUT CONTRAST TECHNIQUE: Multiplanar, multiecho pulse sequences of the brain and surrounding structures were obtained without intravenous contrast. COMPARISON:  CT head and CTA head and neck earlier today. FINDINGS: Brain: There is a subtle 4-5 mm focus of restricted diffusion in the right posterior frontal lobe white matter, posterior right corona radiata (series 7, image 51). No T2 or FLAIR hyperintensity, and no evidence of blood products at this site. No other restricted diffusion. There is a punctate chronic microhemorrhage in the left parietal lobe (series 12, image 30). But elsewhere normal for age gray and white matter signal throughout the brain. Normal cerebral volume for age. No midline shift, mass effect, evidence of mass lesion, ventriculomegaly, extra-axial collection or acute intracranial hemorrhage. Cervicomedullary junction and pituitary are within normal  limits. Vascular: Major intracranial vascular flow voids are preserved. Mild ectasia of  the left ICA siphon. Skull and upper cervical spine: Partially visible cervical vertebral ankylosis or congenital incomplete segmentation at C3-C4. Partially visible C4-C5 degenerative spinal stenosis. There is a small nonspecific but circumscribed and probably benign right frontal bone lesion visible on DWI series 5, image 93. Other visible bone marrow signal is within normal limits. Sinuses/Orbits: Postoperative changes to both globes. Paranasal sinuses and mastoids are stable and well pneumatized. Other: Grossly normal visible internal auditory structures. Scalp and face soft tissues appear negative. IMPRESSION: 1. Subtle 4-5 mm focus of restricted diffusion in the posterior right corona radiata appears more likely to be a small acute white matter lacunar infarct than posttraumatic shear injury. No associated hemorrhage or mass effect. 2. Elsewhere negative for age noncontrast MRI appearance of the brain. 3. 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 Electronically Signed   By: Genevie Ann M.D.   On: 05/18/2019 21:27   DG Pelvis Portable  Result Date: 05/19/2019 CLINICAL DATA:  Stroke, fall EXAM: PORTABLE PELVIS 1-2 VIEWS COMPARISON:  None. FINDINGS: Contrast within the urinary bladder and distal ureters. This obscures the sacrum. SI joints grossly non widened. Pubic symphysis and rami appear intact. Both femoral heads project in joint. No definitive fracture seen. IMPRESSION: Contrast obscures the sacrum. Otherwise no definite acute osseous abnormality is seen Electronically Signed   By: Donavan Foil M.D.   On: 05/19/2019 01:01   DG Chest Portable 1 View  Result Date: 05/19/2019 CLINICAL DATA:  Stroke EXAM: PORTABLE CHEST 1 VIEW COMPARISON:  10/21/2018 FINDINGS: Hyperinflation with emphysematous disease. No consolidation or pleural effusion. Normal heart size with aortic  atherosclerosis. No pneumothorax. Asymmetrically dense left hilus with questionable distortion. IMPRESSION: 1. Emphysematous disease without acute airspace disease 2. Slight asymmetrically dense left hilus with questionable distortion. Chest CT follow-up recommended to exclude mass or adenopathy. Electronically Signed   By: Donavan Foil M.D.   On: 05/19/2019 01:03   ECHOCARDIOGRAM COMPLETE  Result Date: 05/19/2019    ECHOCARDIOGRAM REPORT   Patient Name:   Sarah Rojas [facility] Date of Exam: 05/19/2019 Medical Rec #:  [id number]          Height:       61.0 in Accession #:    [id number]         Weight:       98.0 lb Date of Birth:  February 14, 1940           BSA:          1.395 m Patient Age:    79 years           BP:           96/45 mmHg Patient Gender: F                  HR:           67 bpm. Exam Location:  Inpatient Procedure: 2D Echo, Color Doppler and Cardiac Doppler Indications:    Stroke i163.9  History:        Patient has no prior history of Echocardiogram examinations.                 Risk Factors:Dyslipidemia.  Sonographer:    Raquel Sarna Senior RDCS Referring Phys: [place]  1. Left ventricular ejection fraction, by estimation, is 65 to 70%. The left ventricle has normal function. The left ventricle has no regional wall motion abnormalities. There is mild left ventricular hypertrophy. Left ventricular diastolic parameters are consistent  with age-related delayed relaxation (normal).  2. Right ventricular systolic function is normal. The right ventricular size is normal.  3. The mitral valve is grossly normal. Trivial mitral valve regurgitation.  4. The aortic valve is tricuspid. Aortic valve regurgitation is not visualized.  5. The inferior vena cava is normal in size with greater than 50% respiratory variability, suggesting right atrial pressure of 3 mmHg. FINDINGS  Left Ventricle: Left ventricular ejection fraction, by estimation, is 65 to 70%. The left ventricle has normal function. The  left ventricle has no regional wall motion abnormalities. The left ventricular internal cavity size was normal in size. There is  mild left ventricular hypertrophy. Left ventricular diastolic parameters are consistent with age-related delayed relaxation (normal). Right Ventricle: The right ventricular size is normal. No increase in right ventricular wall thickness. Right ventricular systolic function is normal. Left Atrium: Left atrial size was normal in size. Right Atrium: Right atrial size was normal in size. Pericardium: There is no evidence of pericardial effusion. Mitral Valve: The mitral valve is grossly normal. Trivial mitral valve regurgitation. Tricuspid Valve: The tricuspid valve is grossly normal. Tricuspid valve regurgitation is trivial. Aortic Valve: The aortic valve is tricuspid. Aortic valve regurgitation is not visualized. Pulmonic Valve: The pulmonic valve was not well visualized. Pulmonic valve regurgitation is not visualized. Aorta: The aortic root and ascending aorta are structurally normal, with no evidence of dilitation. Venous: The inferior vena cava is normal in size with greater than 50% respiratory variability, suggesting right atrial pressure of 3 mmHg. IAS/Shunts: The interatrial septum was not well visualized.  LEFT VENTRICLE PLAX 2D LVIDd:         3.70 cm  Diastology LVIDs:         1.90 cm  LV e' lateral:   7.29 cm/s LV PW:         1.10 cm  LV E/e' lateral: 14.7 LV IVS:        0.90 cm  LV e' medial:    7.83 cm/s LVOT diam:     2.00 cm  LV E/e' medial:  13.7 LV SV:         76 LV SV Index:   54 LVOT Area:     3.14 cm  RIGHT VENTRICLE RV S prime:     11.00 cm/s TAPSE (M-mode): 2.0 cm LEFT ATRIUM             Index       RIGHT ATRIUM           Index LA diam:        2.80 cm 2.01 cm/m  RA Area:     12.30 cm LA Vol (A2C):   36.9 ml 26.46 ml/m RA Volume:   29.10 ml  20.86 ml/m LA Vol (A4C):   27.0 ml 19.36 ml/m LA Biplane Vol: 33.4 ml 23.95 ml/m  AORTIC VALVE LVOT Vmax:   116.00 cm/s LVOT  Vmean:  68.500 cm/s LVOT VTI:    0.241 m  AORTA Ao Root diam: 3.00 cm Ao Asc diam:  3.20 cm MITRAL VALVE MV Area (PHT): 3.31 cm     SHUNTS MV Decel Time: 229 msec     Systemic VTI:  0.24 m MV E velocity: 107.00 cm/s  Systemic Diam: 2.00 cm MV A velocity: 78.00 cm/s MV E/A ratio:  1.37 Lyman Bishop MD Electronically signed by Lyman Bishop MD Signature Date/Time: 05/19/2019/10:41:01 AM    Final     PHYSICAL EXAM  Temp:  [97.6 F (36.4 C)-98.9  F (37.2 C)] 98.7 F (37.1 C) (05/18 1510) Pulse Rate:  [60-81] 67 (05/18 1808) Resp:  [13-24] 16 (05/18 1808) BP: (90-115)/(42-95) 100/45 (05/18 1723) SpO2:  [69 %-100 %] 99 % (05/18 1510)  General - Well nourished, well developed, in no apparent distress, mild lethargy.  Ophthalmologic - fundi not visualized due to noncooperation.  Cardiovascular - Regular rhythm and rate.  Mental Status -  Level of arousal and orientation to time, place, and person were intact. Language including expression, naming, repetition, comprehension was assessed and found intact. Mild dysarthria  Cranial Nerves II - XII - II - Visual field intact OU. III, IV, VI - Extraocular movements intact. V - Facial sensation intact bilaterally. VII - Facial movement intact bilaterally. VIII - Hearing & vestibular intact bilaterally. X - Palate elevates symmetrically. XI - Chin turning & shoulder shrug intact bilaterally. XII - Tongue protrusion intact.  Motor Strength - The patient's strength was 4/5 in all extremities and pronator drift was absent.  Bulk was normal and fasciculations were absent.   Motor Tone - Muscle tone was assessed at the neck and appendages and was normal.  Reflexes - The patient's reflexes were symmetrical in all extremities and she had no pathological reflexes.  Sensory - Light touch, temperature/pinprick were assessed and were symmetrical.    Coordination - The patient had grossly normal movements in the hands with no ataxia or dysmetria.   Tremor was absent.  Gait and Station - deferred.   ASSESSMENT/PLAN Sarah Rojas is a 79 y.o. female with history of HLD presenting following 2 falls at home where she hit her head w/ LOC and resultant transient R sided weakness (per daughter) and possible L sided weakness (per EMS)  Likely syncope d/t orthostatic hypotension  CT head Unremarkable   CT CS congenital/degenerative changes  CTA head & neck no LOV. Moderate atherosclerosis head and neck. L subclavian origin 65% stenosis. Emphysema.  MRI  DWI abnormality posterior R corona radiata, but not convinced for infarct per my read, agree with Dr. Cheral Marker that it likely to be artifact. O/w Unremarkable. CS degeneration.  2D Echo EF 65-70%. No source of embolus   LDL 86  HgbA1c pending   Lovenox 30 mg sq daily for VTE prophylaxis  aspirin 81 mg daily prior to admission, now on aspirin 325 mg daily. Continue ASA on discharge.  Therapy recommendations:  HH PT  Disposition:  pending   Orthostatic hypotension  Pt two falls were in upright position  Orthostatic hypotension by EMS as per family  BP low this time even with lying down   On IVF  Discussed with daughter Broadus John, recommend midodrine  Put on TED hose  Avoid dehydration  Hypoglycemia   Glucose 50sx2, 60sx1  Encourage p.o. intake  CBG monitoring - improving  No history of diabetes, hemoglobin A1c pending  Management per primary team  Hyperlipidemia  Home meds:  crestor 5  Now on crestor 10  LDL 85  Continue statin at discharge  Dysphagia . On D 3 w/ nectar thick liquids . Speech on board  Tobacco abuse  Current smoker  Smoking cessation counseling provided  Pt is willing to quit  Other Stroke Risk Factors  Advanced age  Other Active Problems  ARF d/t dehydration and hypotension  R knee pain following fall  Hospital day # 1  Neurology will sign off. Please call with questions. No neuro follow up needed at this  time. Thanks for the consult.  Rosalin Hawking, MD  PhD Stroke Neurology 05/19/2019 6:28 PM  To contact Stroke Continuity provider, please refer to [email or web address] After hours, contact General Neurology

## 2019-05-19 NOTE — ED Notes (Signed)
MD paged due to pt's hypoglycemia and hypotension. Awaiting orders

## 2019-05-19 NOTE — Progress Notes (Signed)
  Speech Language Pathology Treatment: Dysphagia  Patient Details Name: Sarah Rojas MRN: [id number] DOB: Apr 23, 1940 Today's Date: 05/19/2019 Time: [id number] SLP Time Calculation (min) (ACUTE ONLY): 21 min  Assessment / Plan / Recommendation Clinical Impression  As SLP was leaving, daughter arrived to room and SLP determined second session indicated to educate her to findings of swallow evaluation and clinical reasoning for current diet/compensation strategies.  Sarah Rojas *daughter* brough pt's upper dentures and SLP advised she place them for pt before she eats. Sarah Rojas reports she lives next to the pt and can help provided assistance for cooking, bills, medications, etc. Pt did not recognize her daughter in person nor did she select her correct name from choice of 2.  Daughter confirms [name] does not eat well since her spouse passed in 2017 and is essentially a "shut in".  Pt admitted to this SLP that she has some depression.  SLP provided pt with more intake of nectar thick orange juice to help with hypglycemia per RN instructions.  Using teach back and demonstration, reviewed delayed swallow and need to observe laryngeal elevation to assure pt swallows before giving more. Daughter denies knowledge of pt coughing with intake prior to admission but admits she does not eat with her much.  Sarah Rojas became tearful during session.  SLP provided written swallow precaution signs to pt and daughter.  All agreeable to plan.    HPI HPI: 79 yo female adm to [facility] with fall and head trauma, right facial droop and ? change to left arm sensation/strength.  Pt PMH + for smoking, prior cervical fusion C5-C6, bacterial pna possibly.  Pt imaging showed decrease diffusion posterior right corona radiata, cervical spine ankylosis with C4-C5, degenerative spinal stenosis.  Pt did not pass [facility] and her CXR showed emphysema.  Swallow/speech evaluation ordered.      SLP Plan  Continue with current plan of care        Recommendations  Diet recommendations: Nectar-thick liquid Liquids provided via: Cup;Teaspoon Medication Administration: Whole meds with puree Supervision: Patient able to self feed Compensations: Small sips/bites;Minimize environmental distractions;Slow rate Postural Changes and/or Swallow Maneuvers: Seated upright 90 degrees;Upright 30-60 min after meal(allow time for reflexive dry swallows)                Oral Care Recommendations: Oral care BID Follow up Recommendations: 24 hour supervision/assistance SLP Visit Diagnosis: Dysphagia, oropharyngeal phase (R13.12) Plan: Continue with current plan of care       GO                Macario Golds 05/19/2019, 9:48 AM  Kathleen Lime, MS [place] Office [phone]

## 2019-05-19 NOTE — H&P (Addendum)
History and Physical    Sarah Rojas [id number] DOB: 07/08/1940 DOA: 05/18/2019  PCP: Brand Males, MD  Patient coming from: Home.  Chief Complaint: Loss of consciousness and right facial drooping and slurred speech.  HPI: Sarah Rojas is a 79 y.o. female with history of tobacco abuse and possible COPD had 2 syncopal episodes over the last 24 hours.  As per the daughter patient had a syncopal episode on May 17, 2019 2 days ago at her home while in the bathroom does not recall how long she was on the floor.  Patient also does not remember why she fell.  Again she had one yesterday.  Following which patient's daughter went to check on her and found that she right side of the face was drooping along with patient having some slurred speech and left-sided weakness.  Patient was brought to the ER.  Patient has been feeling generally weak for the last 24 hours.  ED Course: In the ER patient appears mildly lethargic and patient underwent CT head and CT C-spine which did not show anything acute.  CT angiogram of the head and neck did not show any large vessel obstruction but MRI was showing diffusion restriction concerning for stroke.  EKG shows normal sinus rhythm.  Neurology was consulted.  Patient labs show hyperglycemia with blood sugar dropping into the 50s.  CBC was unremarkable.  Creatinine 1.2 chest x-ray showing possible mass.  Patient was given D50 for hypoglycemia and admitted for possible acute CVA with hypoglycemia and hypotension.  Review of Systems: As per HPI, rest all negative.   Past Medical History:  Diagnosis Date  . High cholesterol     Past Surgical History:  Procedure Laterality Date  . ABDOMINAL HYSTERECTOMY       reports that she has been smoking cigarettes. She has been smoking about 0.50 packs per day. She has never used smokeless tobacco. She reports that she does not drink alcohol or use drugs.  Allergies  Allergen Reactions  . Latex Rash     Family History  Problem Relation Age of Onset  . Diabetes Mellitus II Daughter     Prior to Admission medications   Medication Sig Start Date End Date Taking? Authorizing Provider  albuterol (VENTOLIN HFA) 108 (90 Base) MCG/ACT inhaler Inhale 1 puff into the lungs every 4 (four) hours as needed.  01/28/19  Yes [provider]  aspirin EC 81 MG tablet Take 81 mg by mouth daily.   Yes [provider]  Aspirin-Salicylamide-Caffeine (BC HEADACHE POWDER PO) Take 1 Package by mouth daily as needed (for headache).    Yes [provider]  diazepam (VALIUM) 5 MG tablet Take 5 mg by mouth 3 (three) times daily as needed. 05/06/19  Yes [provider]  fluticasone (FLONASE) 50 MCG/ACT nasal spray Place 1 spray into both nostrils daily as needed for allergies or rhinitis.  02/05/19  Yes [provider]  Multiple Vitamin (MULTIVITAMIN WITH MINERALS) TABS tablet Take 1 tablet by mouth daily.   Yes [provider]  rosuvastatin (CRESTOR) 5 MG tablet Take 5 mg by mouth daily. 03/13/19  Yes [provider]    Physical Exam: Constitutional: Moderately built and nourished. Vitals:   05/19/19 0300 05/19/19 0317 05/19/19 0330 05/19/19 0345  BP: (!) 97/42 (!) 92/54 (!) 93/45 (!) 103/47  Pulse: 65 60 63 76  Resp: 18 15 15  (!) 21  Temp:      TempSrc:      SpO2: 99%  99% 99% 100%  Weight:      Height:       Eyes: Anicteric no pallor. ENMT: No discharge from the ears eyes nose or mouth. Neck: No mass or.  No neck rigidity. Respiratory: No rhonchi or crepitations. Cardiovascular: S1-S2 heard. Abdomen: Soft nontender bowel sound present. Musculoskeletal: No edema. Skin: No rash. Neurologic: Patient is mildly lethargic but otherwise follows commands is oriented to her name.  Is moving all extremities with generalized weakness.  No definite pronator drift.  Pupils are equal reacting to light no obvious facial asymmetry. Psychiatric: Mildly  lethargic.   Labs on Admission: I have personally reviewed following labs and imaging studies  CBC: Recent Labs  Lab 05/18/19 1738 05/18/19 1756 05/18/19 2235  WBC 7.7  --  6.1  NEUTROABS 5.7  --   --   HGB 12.3 12.2 12.5  HCT 38.3 36.0 39.8  MCV 100.0  --  101.5*  PLT 163  --  [id number]   Basic Metabolic Panel: Recent Labs  Lab 05/18/19 1738 05/18/19 1756 05/18/19 2235  NA 140 140  --   K 4.0 3.9  --   CL 107 109  --   CO2 22  --   --   GLUCOSE 77 69*  --   BUN 20 23  --   CREATININE 1.28* 1.20* 1.22*  CALCIUM 8.8*  --   --    GFR: Estimated Creatinine Clearance: 26.7 mL/min (A) (by C-G formula based on SCr of 1.22 mg/dL (H)). Liver Function Tests: Recent Labs  Lab 05/18/19 1738  AST 27  ALT 12  ALKPHOS 40  BILITOT 0.4  PROT 6.1*  ALBUMIN 3.6   No results for input(s): LIPASE, AMYLASE in the last 168 hours. No results for input(s): AMMONIA in the last 168 hours. Coagulation Profile: Recent Labs  Lab 05/18/19 1738  INR 1.1   Cardiac Enzymes: Recent Labs  Lab 05/18/19 1738  CKTOTAL 358*   BNP (last 3 results) No results for input(s): PROBNP in the last 8760 hours. HbA1C: No results for input(s): HGBA1C in the last 72 hours. CBG: Recent Labs  Lab 05/18/19 2207 05/18/19 2315 05/19/19 0020 05/19/19 0211 05/19/19 0319  GLUCAP 50* 163* 108* 59* 143*   Lipid Profile: Recent Labs    05/18/19 2236  CHOL 157  HDL 57  LDLCALC 85  TRIG 76  CHOLHDL 2.8   Thyroid Function Tests: Recent Labs    05/18/19 2240  TSH 2.886   Anemia Panel: No results for input(s): VITAMINB12, FOLATE, FERRITIN, TIBC, IRON, RETICCTPCT in the last 72 hours. Urine analysis: No results found for: COLORURINE, APPEARANCEUR, LABSPEC, PHURINE, GLUCOSEU, HGBUR, BILIRUBINUR, KETONESUR, PROTEINUR, UROBILINOGEN, NITRITE, LEUKOCYTESUR Sepsis Labs: @LABRCNTIP (procalcitonin:4,lacticidven:4) ) Recent Results (from the past 240 hour(s))  SARS Coronavirus 2 by RT PCR (hospital  order, performed in [facility] hospital lab) Nasopharyngeal Nasopharyngeal Swab     Status: None   Collection Time: 05/18/19 11:18 PM   Specimen: Nasopharyngeal Swab  Result Value Ref Range Status   SARS Coronavirus 2 NEGATIVE NEGATIVE Final    Comment: (NOTE) SARS-CoV-2 target nucleic acids are NOT DETECTED. The SARS-CoV-2 RNA is generally detectable in upper and lower respiratory specimens during the acute phase of infection. The lowest concentration of SARS-CoV-2 viral copies this assay can detect is 250 copies / mL. A negative result does not preclude SARS-CoV-2 infection and should not be used as the sole basis for treatment or other patient management decisions.  A negative result may occur with improper  specimen collection / handling, submission of specimen other than nasopharyngeal swab, presence of viral mutation(s) within the areas targeted by this assay, and inadequate number of viral copies (<250 copies / mL). A negative result must be combined with clinical observations, patient history, and epidemiological information. Fact Sheet for Patients:   StrictlyIdeas.no Fact Sheet for Healthcare Providers: BankingDealers.co.za This test is not yet approved or cleared  by the [place] FDA and has been authorized for detection and/or diagnosis of SARS-CoV-2 by FDA under an Emergency Use Authorization (EUA).  This EUA will remain in effect (meaning this test can be used) for the duration of the COVID-19 declaration under Section 564(b)(1) of the Act, 21 U.S.C. section 360bbb-3(b)(1), unless the authorization is terminated or revoked sooner. Performed at [facility], [place] [address], [place], [place] [address]      Radiological Exams on Admission: CT Angio Head W/Cm &/Or Wo Cm  Result Date: 05/18/2019 CLINICAL DATA:  Initial evaluation for acute speech difficulty, decreased sensation in left upper extremity. EXAM: CT  ANGIOGRAPHY HEAD AND NECK TECHNIQUE: Multidetector CT imaging of the head and neck was performed using the standard protocol during bolus administration of intravenous contrast. Multiplanar CT image reconstructions and MIPs were obtained to evaluate the vascular anatomy. Carotid stenosis measurements (when applicable) are obtained utilizing NASCET criteria, using the distal internal carotid diameter as the denominator. CONTRAST:  21mL OMNIPAQUE IOHEXOL 350 MG/ML SOLN COMPARISON:  Prior head CT from earlier the same day. FINDINGS: CTA NECK FINDINGS Aortic arch: Visualized aortic arch of normal caliber with normal branch pattern. Moderate to advanced atheromatous change seen about the aortic arch and origin of the great vessels short-segment approximate 65% stenosis noted at the origin of the left subclavian artery (series 8, image 311). Additional extensive atheromatous change throughout the visualized subclavian arteries without high-grade stenosis. No other hemodynamically significant stenosis seen about the origin of the great vessels. Right carotid system: Scattered atheromatous change throughout the right common and internal carotid arteries without hemodynamically significant stenosis. Mild atheromatous change about the right carotid bifurcation without significant stenosis. Left carotid system: Scattered atheromatous change throughout the left CCA without high-grade stenosis. Scattered eccentric calcified plaque about the left bifurcation/proximal left ICA without significant stenosis. Left ICA widely patent distally to the skull base without stenosis, dissection or occlusion. Vertebral arteries: Both vertebral arteries arise from the subclavian arteries. Vertebral arteries widely patent without stenosis, dissection or occlusion. Skeleton: No acute osseous abnormality. No discrete or worrisome osseous lesions. 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 Other neck: No other acute soft  tissue abnormality within the neck. No mass lesion or adenopathy. Upper chest: Extensive paraseptal and centrilobular emphysematous changes noted within the visualized lungs. Associated irregular biapical pleuroparenchymal thickening and/or scarring. Visualized upper chest demonstrates no other acute finding. Review of the MIP images confirms the above findings CTA HEAD FINDINGS Anterior circulation: Petrous segments widely patent bilaterally. Scattered atheromatous plaque within the cavernous/supraclinoid ICAs without hemodynamically significant stenosis. A1 segments widely patent. Right A1 hypoplastic, accounting for the slightly diminutive right ICA is compared to the left. Normal anterior communicating artery complex. Anterior cerebral arteries widely patent to their distal aspects without stenosis. No M1 stenosis or occlusion. Normal MCA bifurcations. Distal MCA branches well perfused and symmetric. Posterior circulation: Both vertebral arteries widely patent to the vertebrobasilar junction without stenosis. Both picas patent. Basilar widely patent to its distal aspect without stenosis. Superior cerebral arteries patent bilaterally. Right PCA primarily supplied via the basilar.  Left PCA supplied via a hypoplastic left P1 segment and robust left posterior communicating artery. Both PCAs well perfused to their distal aspects without stenosis. Venous sinuses: Patent. Anatomic variants: Hypoplastic right A1 and left P1 segments. No intracranial aneurysm or other vascular abnormality. Review of the MIP images confirms the above findings IMPRESSION: 1. Negative CTA for large vessel occlusion. 2. Moderate atheromatous change about the major arterial vasculature of the head and neck, most pronounced at the aortic arch. Associated short-segment 65% stenosis at the origin of the left subclavian artery. No other hemodynamically significant or correctable stenosis identified. 3.  Emphysema (ICD10-J43.9). Electronically  Signed   By: Jeannine Boga M.D.   On: 05/18/2019 21:01   CT Head Wo Contrast  Result Date: 05/18/2019 CLINICAL DATA:  Fall, head trauma EXAM: CT HEAD WITHOUT CONTRAST TECHNIQUE: Contiguous axial images were obtained from the base of the skull through the vertex without intravenous contrast. COMPARISON:  11/13/2016 FINDINGS: Brain: No acute intracranial abnormality. Specifically, no hemorrhage, hydrocephalus, mass lesion, acute infarction, or significant intracranial injury. Vascular: No hyperdense vessel or unexpected calcification. Skull: No acute calvarial abnormality. Sinuses/Orbits: Visualized paranasal sinuses and mastoids clear. Orbital soft tissues unremarkable. Other: None IMPRESSION: Unremarkable study. Electronically Signed   By: Rolm Baptise M.D.   On: 05/18/2019 18:28   CT Angio Neck W and/or Wo Contrast  Result Date: 05/18/2019 CLINICAL DATA:  Initial evaluation for acute speech difficulty, decreased sensation in left upper extremity. EXAM: CT ANGIOGRAPHY HEAD AND NECK TECHNIQUE: Multidetector CT imaging of the head and neck was performed using the standard protocol during bolus administration of intravenous contrast. Multiplanar CT image reconstructions and MIPs were obtained to evaluate the vascular anatomy. Carotid stenosis measurements (when applicable) are obtained utilizing NASCET criteria, using the distal internal carotid diameter as the denominator. CONTRAST:  19mL OMNIPAQUE IOHEXOL 350 MG/ML SOLN COMPARISON:  Prior head CT from earlier the same day. FINDINGS: CTA NECK FINDINGS Aortic arch: Visualized aortic arch of normal caliber with normal branch pattern. Moderate to advanced atheromatous change seen about the aortic arch and origin of the great vessels short-segment approximate 65% stenosis noted at the origin of the left subclavian artery (series 8, image 311). Additional extensive atheromatous change throughout the visualized subclavian arteries without high-grade stenosis.  No other hemodynamically significant stenosis seen about the origin of the great vessels. Right carotid system: Scattered atheromatous change throughout the right common and internal carotid arteries without hemodynamically significant stenosis. Mild atheromatous change about the right carotid bifurcation without significant stenosis. Left carotid system: Scattered atheromatous change throughout the left CCA without high-grade stenosis. Scattered eccentric calcified plaque about the left bifurcation/proximal left ICA without significant stenosis. Left ICA widely patent distally to the skull base without stenosis, dissection or occlusion. Vertebral arteries: Both vertebral arteries arise from the subclavian arteries. Vertebral arteries widely patent without stenosis, dissection or occlusion. Skeleton: No acute osseous abnormality. No discrete or worrisome osseous lesions. 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 Other neck: No other acute soft tissue abnormality within the neck. No mass lesion or adenopathy. Upper chest: Extensive paraseptal and centrilobular emphysematous changes noted within the visualized lungs. Associated irregular biapical pleuroparenchymal thickening and/or scarring. Visualized upper chest demonstrates no other acute finding. Review of the MIP images confirms the above findings CTA HEAD FINDINGS Anterior circulation: Petrous segments widely patent bilaterally. Scattered atheromatous plaque within the cavernous/supraclinoid ICAs without hemodynamically significant stenosis. A1 segments widely patent. Right A1 hypoplastic, accounting for the slightly diminutive right ICA is  compared to the left. Normal anterior communicating artery complex. Anterior cerebral arteries widely patent to their distal aspects without stenosis. No M1 stenosis or occlusion. Normal MCA bifurcations. Distal MCA branches well perfused and symmetric. Posterior circulation: Both vertebral arteries  widely patent to the vertebrobasilar junction without stenosis. Both picas patent. Basilar widely patent to its distal aspect without stenosis. Superior cerebral arteries patent bilaterally. Right PCA primarily supplied via the basilar. Left PCA supplied via a hypoplastic left P1 segment and robust left posterior communicating artery. Both PCAs well perfused to their distal aspects without stenosis. Venous sinuses: Patent. Anatomic variants: Hypoplastic right A1 and left P1 segments. No intracranial aneurysm or other vascular abnormality. Review of the MIP images confirms the above findings IMPRESSION: 1. Negative CTA for large vessel occlusion. 2. Moderate atheromatous change about the major arterial vasculature of the head and neck, most pronounced at the aortic arch. Associated short-segment 65% stenosis at the origin of the left subclavian artery. No other hemodynamically significant or correctable stenosis identified. 3.  Emphysema (ICD10-J43.9). Electronically Signed   By: Jeannine Boga M.D.   On: 05/18/2019 21:01   CT Cervical Spine Wo Contrast  Result Date: 05/18/2019 CLINICAL DATA:  Fall EXAM: CT CERVICAL SPINE WITHOUT CONTRAST TECHNIQUE: Multidetector CT imaging of the cervical spine was performed without intravenous contrast. Multiplanar CT image reconstructions were also generated. COMPARISON:  None. FINDINGS: Alignment: Normal alignment. Skull base and vertebrae: No acute fracture. No primary bone lesion or focal pathologic process. Soft tissues and spinal canal: No prevertebral fluid or swelling. No visible canal hematoma. 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 Degenerative disc and facet disease. Upper chest: Biapical scarring. 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 No acute bony abnormality. Electronically Signed   By: Rolm Baptise M.D.   On: 05/18/2019 18:29   MR Brain Wo Contrast  (neuro protocol)  Result Date: 05/18/2019 CLINICAL DATA:  79 year old female status post fall with head trauma, abnormal speech and left arm sensation subsequently. EXAM: MRI HEAD WITHOUT CONTRAST TECHNIQUE: Multiplanar, multiecho pulse sequences of the brain and surrounding structures were obtained without intravenous contrast. COMPARISON:  CT head and CTA head and neck earlier today. FINDINGS: Brain: There is a subtle 4-5 mm focus of restricted diffusion in the right posterior frontal lobe white matter, posterior right corona radiata (series 7, image 51). No T2 or FLAIR hyperintensity, and no evidence of blood products at this site. No other restricted diffusion. There is a punctate chronic microhemorrhage in the left parietal lobe (series 12, image 30). But elsewhere normal for age gray and white matter signal throughout the brain. Normal cerebral volume for age. No midline shift, mass effect, evidence of mass lesion, ventriculomegaly, extra-axial collection or acute intracranial hemorrhage. Cervicomedullary junction and pituitary are within normal limits. Vascular: Major intracranial vascular flow voids are preserved. Mild ectasia of the left ICA siphon. Skull and upper cervical spine: Partially visible cervical vertebral ankylosis or congenital incomplete segmentation at C3-C4. Partially visible C4-C5 degenerative spinal stenosis. There is a small nonspecific but circumscribed and probably benign right frontal bone lesion visible on DWI series 5, image 93. Other visible bone marrow signal is within normal limits. Sinuses/Orbits: Postoperative changes to both globes. Paranasal sinuses and mastoids are stable and well pneumatized. Other: Grossly normal visible internal auditory structures. Scalp and face soft tissues appear negative. IMPRESSION: 1. Subtle 4-5 mm focus of restricted diffusion in the posterior right corona radiata appears more  likely to be a small acute white matter lacunar infarct than  posttraumatic shear injury. No associated hemorrhage or mass effect. 2. Elsewhere negative for age noncontrast MRI appearance of the brain. 3. 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 Electronically Signed   By: Genevie Ann M.D.   On: 05/18/2019 21:27   DG Pelvis Portable  Result Date: 05/19/2019 CLINICAL DATA:  Stroke, fall EXAM: PORTABLE PELVIS 1-2 VIEWS COMPARISON:  None. FINDINGS: Contrast within the urinary bladder and distal ureters. This obscures the sacrum. SI joints grossly non widened. Pubic symphysis and rami appear intact. Both femoral heads project in joint. No definitive fracture seen. IMPRESSION: Contrast obscures the sacrum. Otherwise no definite acute osseous abnormality is seen Electronically Signed   By: Donavan Foil M.D.   On: 05/19/2019 01:01   DG Chest Portable 1 View  Result Date: 05/19/2019 CLINICAL DATA:  Stroke EXAM: PORTABLE CHEST 1 VIEW COMPARISON:  10/21/2018 FINDINGS: Hyperinflation with emphysematous disease. No consolidation or pleural effusion. Normal heart size with aortic atherosclerosis. No pneumothorax. Asymmetrically dense left hilus with questionable distortion. IMPRESSION: 1. Emphysematous disease without acute airspace disease 2. Slight asymmetrically dense left hilus with questionable distortion. Chest CT follow-up recommended to exclude mass or adenopathy. Electronically Signed   By: Donavan Foil M.D.   On: 05/19/2019 01:03    EKG: Independently reviewed.  Normal sinus rhythm.  Assessment/Plan Principal Problem:   Acute CVA (cerebrovascular accident) ([place]) Active Problems:   Hypoglycemia   Tobacco abuse    1. Acute CVA -appreciate neurology consult.  We will keep patient on neurochecks check hemoglobin A1c lipid panel physical therapy consult.  Patient failed swallow evaluation we will get speech therapy consult.  Aspirin.  Once patient passes swallow may need statins.  Check 2D echo. 2. Hypoglycemia cause  not clear.  Check C-peptide levels follow hemoglobin A1c beta hydroxybutyric acid level cortisol levels.  Closely follow CBGs for now.  Patient has received D50 1 ampoule. 3. Hypotension -patient has been having episodes of hypotension.  Did order 5 cc normal saline bolus.  Will check lactate level troponin procalcitonin level no definite signs of any infection.  UA is pending.  Continue hydration. 4. Syncope could be from hypotension.  Closely monitor in telemetry, check 2D echo. 5. Acute renal failure could be from dehydration and hypotension.  Gently hydrate follow metabolic panel.  UA is pending. 6. Abnormal chest x-ray for which I have ordered CT chest. 7. Possible COPD on inhalers. 8. Right knee pain after fall x-rays do not show any definite fracture.  Continue to monitor.   Patient has possible CVA and also has been persistently hypoglycemic with hypotension will need close monitoring for any further deterioration in inpatient status.   DVT prophylaxis: Lovenox. Code Status: Full code as discussed with patient's daughter. Family Communication: Patient's daughter. Disposition Plan: To be determined. Consults called: Neurologist. Admission status: Inpatient.   Rise Patience MD [facility] Pager [phone].  If 7PM-7AM, please contact night-coverage [email or web address] Password [facility]  05/19/2019, 4:17 AM

## 2019-05-19 NOTE — Progress Notes (Signed)
OT Cancellation Note  Patient Details Name: TIAHNA SHIERS MRN: [id number] DOB: 1940/06/24   Cancelled Treatment:    Reason Eval/Treat Not Completed: Patient at procedure or test/ unavailable(at CT earlier, now getting Echo). OT will continue to follow for evaluation  Jaci Carrel 05/19/2019, 9:51 AM   Jesse Sans OTR/L Acute Rehabilitation Services Pager: [phone] Office: [phone]

## 2019-05-19 NOTE — Consult Note (Signed)
Referring Physician: Dr. Hal Hope    Chief Complaint: Left arm weakness  HPI: Sarah Rojas is an 79 y.o. female with a PMHx of hypercholesterolemia who presented to the ED on Monday evening after a fall at home on Sunday. She hit her head with LOC, but does not know how long she was unconscious. She fell and hit her head again on Monday morning. Her daughter related to the EDP that she had noticed that her mother was having some slurred speech, some aphasia, right facial droop and right eye drooping for about 3 minutes on Monday afternoon, in addition to generalized weakness. EMS noted weakness on the opposite side as the daughter: "some left sided upper extremity weakness and sensation changes".  The patient endorsed to EDP that she did no recall any symptoms prior to the falls and did not know why she fell. She did state that she "just lost vision and blacked out". Her BP was 108/53 in the ED. Mucus membranes were noted to be dry. Early this AM, while in the ED, the patient had an episode od hypotension and hypoglycemia. MRI brain obtained in the ED on Monday night was read as positive for a small stroke by Radiology; however, on personal review of the images by Neurology, there is a punctate, bland right corona radiata DWI hyperintensity that appears more consistent with artifact than an acute ischemic infarction.   MRI brain (official report conclusions): 1. Subtle 4-5 mm focus of restricted diffusion in the posterior right corona radiata appears more likely to be a small acute white matter lacunar infarct than posttraumatic shear injury. No associated hemorrhage or mass effect. 2. Elsewhere negative for age noncontrast MRI appearance of the brain. 3. 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  LSN: Sunday at an unknown time tPA Given: No: Out of time window  Past Medical History:  Diagnosis Date  . High cholesterol     Past Surgical  History:  Procedure Laterality Date  . ABDOMINAL HYSTERECTOMY      Family History  Problem Relation Age of Onset  . Diabetes Mellitus II Daughter    Social History:  reports that she has been smoking cigarettes. She has been smoking about 0.50 packs per day. She has never used smokeless tobacco. She reports that she does not drink alcohol or use drugs.  Allergies:  Allergies  Allergen Reactions  . Latex Rash    Medications:  Prior to Admission: (Not in a hospital admission)  Scheduled: . aspirin  300 mg Rectal Daily   Or  . aspirin  325 mg Oral Daily  . dextrose      . enoxaparin (LOVENOX) injection  30 mg Subcutaneous Q24H   Continuous: . sodium chloride 75 mL/hr at 05/19/19 0056     ROS: The patient is currently not cooperative with exam  Physical Examination: Blood pressure (!) 103/47, pulse 76, temperature 97.8 F (36.6 C), temperature source Oral, resp. rate (!) 21, height 5\' 1"  (1.549 m), weight 44.5 kg, SpO2 100 %.  HEENT: Normocephalic Lungs: Respirations unlabored Ext: No edema  Neurologic Examination: Ment: Drowsy. Limited speech output is fluent. She states her age is "11". Otherwise non-cooperative with questioning. Not reliably following commands. Mild dysarthria noted in the context of being edentulous.  CN: PERRL. Will not track to the right or left. Does not reliably blink to threat. Smiles weakly - symmetric. Hearing grossly intact. Phonation intact. Head is midline.  Motor: Holds hands clasped near chest and  will resist examiner's attempts to move them. Not following motor commands.  Weakly moves each LE in response to noxious plantar stimulation without gross asymmetry.  Sensory: Non-cooperative. Reflexes: 1+ bilateral brachioradialis and patellae. Toes mute.  Cerebellar: Non-cooperative. Gait: Unable to assess.   Results for orders placed or performed during the hospital encounter of 05/18/19 (from the past 48 hour(s))  Ethanol     Status: None    Collection Time: 05/18/19  5:38 PM  Result Value Ref Range   Alcohol, Ethyl (B) <10 <10 mg/dL    Comment: (NOTE) Lowest detectable limit for serum alcohol is 10 mg/dL. For medical purposes only. Performed at [facility], [place] [address], [place], [place] [address]   Protime-INR     Status: None   Collection Time: 05/18/19  5:38 PM  Result Value Ref Range   Prothrombin Time 13.6 11.4 - 15.2 seconds   INR 1.1 0.8 - 1.2    Comment: (NOTE) INR goal varies based on device and disease states. Performed at [facility], [place] [address], [place], [place] [address]   APTT     Status: None   Collection Time: 05/18/19  5:38 PM  Result Value Ref Range   aPTT 36 24 - 36 seconds    Comment: Performed at [facility] [address], [place], [place] [address]  CBC     Status: Abnormal   Collection Time: 05/18/19  5:38 PM  Result Value Ref Range   WBC 7.7 4.0 - 10.5 K/uL   RBC 3.83 (L) 3.87 - 5.11 MIL/uL   Hemoglobin 12.3 12.0 - 15.0 g/dL   HCT 38.3 36.0 - 46.0 %   MCV 100.0 80.0 - 100.0 fL   MCH 32.1 26.0 - 34.0 pg   MCHC 32.1 30.0 - 36.0 g/dL   RDW 13.2 11.5 - 15.5 %   Platelets 163 150 - 400 K/uL   nRBC 0.0 0.0 - 0.2 %    Comment: Performed at [facility], [place] [address] [place], [place] [address]  Differential     Status: None   Collection Time: 05/18/19  5:38 PM  Result Value Ref Range   Neutrophils Relative % 74 %   Neutro Abs 5.7 1.7 - 7.7 K/uL   Lymphocytes Relative 16 %   Lymphs Abs 1.2 0.7 - 4.0 K/uL   Monocytes Relative 8 %   Monocytes Absolute 0.6 0.1 - 1.0 K/uL   Eosinophils Relative 1 %   Eosinophils Absolute 0.1 0.0 - 0.5 K/uL   Basophils Relative 1 %   Basophils Absolute 0.1 0.0 - 0.1 K/uL   Immature Granulocytes 0 %   Abs Immature Granulocytes 0.02 0.00 - 0.07 K/uL    Comment: Performed at [facility] [address], [place], [place] [address]  Comprehensive metabolic panel     Status: Abnormal   Collection Time: 05/18/19   5:38 PM  Result Value Ref Range   Sodium 140 135 - 145 mmol/L   Potassium 4.0 3.5 - 5.1 mmol/L   Chloride 107 98 - 111 mmol/L   CO2 22 22 - 32 mmol/L   Glucose, Bld 77 70 - 99 mg/dL    Comment: Glucose reference range applies only to samples taken after fasting for at least 8 hours.   BUN 20 8 - 23 mg/dL   Creatinine, Ser 1.28 (H) 0.44 - 1.00 mg/dL   Calcium 8.8 (L) 8.9 - 10.3 mg/dL   Total Protein 6.1 (  L) 6.5 - 8.1 g/dL   Albumin 3.6 3.5 - 5.0 g/dL   AST 27 15 - 41 U/L   ALT 12 0 - 44 U/L   Alkaline Phosphatase 40 38 - 126 U/L   Total Bilirubin 0.4 0.3 - 1.2 mg/dL   GFR calc non Af Amer 40 (L) >60 mL/min   GFR calc Af Amer 46 (L) >60 mL/min   Anion gap 11 5 - 15    Comment: Performed at [facility] [address], [place], [place] [address]  CK     Status: Abnormal   Collection Time: 05/18/19  5:38 PM  Result Value Ref Range   Total CK 358 (H) 38 - 234 U/L    Comment: Performed at [facility], [place] [address], [place], [place] [address]  I-stat chem 8, ED     Status: Abnormal   Collection Time: 05/18/19  5:56 PM  Result Value Ref Range   Sodium 140 135 - 145 mmol/L   Potassium 3.9 3.5 - 5.1 mmol/L   Chloride 109 98 - 111 mmol/L   BUN 23 8 - 23 mg/dL   Creatinine, Ser 1.20 (H) 0.44 - 1.00 mg/dL   Glucose, Bld 69 (L) 70 - 99 mg/dL    Comment: Glucose reference range applies only to samples taken after fasting for at least 8 hours.   Calcium, Ion 1.06 (L) 1.15 - 1.40 mmol/L   TCO2 27 22 - 32 mmol/L   Hemoglobin 12.2 12.0 - 15.0 g/dL   HCT 36.0 36.0 - 46.0 %  CBG monitoring, ED     Status: Abnormal   Collection Time: 05/18/19 10:07 PM  Result Value Ref Range   Glucose-Capillary 50 (L) 70 - 99 mg/dL    Comment: Glucose reference range applies only to samples taken after fasting for at least 8 hours.  CBC     Status: Abnormal   Collection Time: 05/18/19 10:35 PM  Result Value Ref Range   WBC 6.1 4.0 - 10.5 K/uL   RBC 3.92 3.87 - 5.11 MIL/uL   Hemoglobin 12.5  12.0 - 15.0 g/dL   HCT 39.8 36.0 - 46.0 %   MCV 101.5 (H) 80.0 - 100.0 fL   MCH 31.9 26.0 - 34.0 pg   MCHC 31.4 30.0 - 36.0 g/dL   RDW 13.2 11.5 - 15.5 %   Platelets 177 150 - 400 K/uL   nRBC 0.0 0.0 - 0.2 %    Comment: Performed at [facility], [place] [address], [place], [place] [address]  Creatinine, serum     Status: Abnormal   Collection Time: 05/18/19 10:35 PM  Result Value Ref Range   Creatinine, Ser 1.22 (H) 0.44 - 1.00 mg/dL   GFR calc non Af Amer 42 (L) >60 mL/min   GFR calc Af Amer 49 (L) >60 mL/min    Comment: Performed at [facility] [address] [place],  [address]  Lipid panel     Status: None   Collection Time: 05/18/19 10:36 PM  Result Value Ref Range   Cholesterol 157 0 - 200 mg/dL   Triglycerides 76 <150 mg/dL   HDL 57 >40 mg/dL   Total CHOL/HDL Ratio 2.8 RATIO   VLDL 15 0 - 40 mg/dL   LDL Cholesterol 85 0 - 99 mg/dL    Comment:        Total Cholesterol/HDL:CHD Risk Coronary Heart Disease Risk Table  Men   Women  1/2 Average Risk   3.4   3.3  Average Risk       5.0   4.4  2 X Average Risk   9.6   7.1  3 X Average Risk  23.4   11.0        Use the calculated Patient Ratio above and the CHD Risk Table to determine the patient's CHD Risk.        ATP III CLASSIFICATION (LDL):  <100     mg/dL   Optimal  100-129  mg/dL   Near or Above                    Optimal  130-159  mg/dL   Borderline  160-189  mg/dL   High  >190     mg/dL   Very High Performed at [facility] [address] [place], [place] [address]   Cortisol     Status: None   Collection Time: 05/18/19 10:40 PM  Result Value Ref Range   Cortisol, Plasma 20.0 ug/dL    Comment: (NOTE) AM    6.7 - 22.6 ug/dL PM   <10.0       ug/dL Performed at [facility] [address], [place], [place] [address]   Beta-hydroxybutyric acid     Status: Abnormal   Collection Time: 05/18/19 10:40 PM  Result Value Ref Range   Beta-Hydroxybutyric Acid  0.43 (H) 0.05 - 0.27 mmol/L    Comment: Performed at [facility] [address], [place], [place] [address]  TSH     Status: None   Collection Time: 05/18/19 10:40 PM  Result Value Ref Range   TSH 2.886 0.350 - 4.500 uIU/mL    Comment: Performed by a 3rd Generation assay with a functional sensitivity of <=0.01 uIU/mL. Performed at [facility], [place] [address] [place], [place] [address]   CBG monitoring, ED     Status: Abnormal   Collection Time: 05/18/19 11:15 PM  Result Value Ref Range   Glucose-Capillary 163 (H) 70 - 99 mg/dL    Comment: Glucose reference range applies only to samples taken after fasting for at least 8 hours.  SARS Coronavirus 2 by RT PCR (hospital order, performed in [facility] hospital lab) Nasopharyngeal Nasopharyngeal Swab     Status: None   Collection Time: 05/18/19 11:18 PM   Specimen: Nasopharyngeal Swab  Result Value Ref Range   SARS Coronavirus 2 NEGATIVE NEGATIVE    Comment: (NOTE) SARS-CoV-2 target nucleic acids are NOT DETECTED. The SARS-CoV-2 RNA is generally detectable in upper and lower respiratory specimens during the acute phase of infection. The lowest concentration of SARS-CoV-2 viral copies this assay can detect is 250 copies / mL. A negative result does not preclude SARS-CoV-2 infection and should not be used as the sole basis for treatment or other patient management decisions.  A negative result may occur with improper specimen collection / handling, submission of specimen other than nasopharyngeal swab, presence of viral mutation(s) within the areas targeted by this assay, and inadequate number of viral copies (<250 copies / mL). A negative result must be combined with clinical observations, patient history, and epidemiological information. Fact Sheet for Patients:   StrictlyIdeas.no Fact Sheet for Healthcare Providers: BankingDealers.co.za This test is not yet approved or  cleared  by the [place] FDA and has been authorized for detection and/or diagnosis of SARS-CoV-2 by FDA under an Emergency Use Authorization (  EUA).  This EUA will remain in effect (meaning this test can be used) for the duration of the COVID-19 declaration under Section 564(b)(1) of the Act, 21 U.S.C. section 360bbb-3(b)(1), unless the authorization is terminated or revoked sooner. Performed at [facility], [place] [address], [place], [place] [address]   CBG monitoring, ED     Status: Abnormal   Collection Time: 05/19/19 12:20 AM  Result Value Ref Range   Glucose-Capillary 108 (H) 70 - 99 mg/dL    Comment: Glucose reference range applies only to samples taken after fasting for at least 8 hours.  CBG monitoring, ED     Status: Abnormal   Collection Time: 05/19/19  2:11 AM  Result Value Ref Range   Glucose-Capillary 59 (L) 70 - 99 mg/dL    Comment: Glucose reference range applies only to samples taken after fasting for at least 8 hours.  CBG monitoring, ED     Status: Abnormal   Collection Time: 05/19/19  3:19 AM  Result Value Ref Range   Glucose-Capillary 143 (H) 70 - 99 mg/dL    Comment: Glucose reference range applies only to samples taken after fasting for at least 8 hours.   CT Angio Head W/Cm &/Or Wo Cm  Result Date: 05/18/2019 CLINICAL DATA:  Initial evaluation for acute speech difficulty, decreased sensation in left upper extremity. EXAM: CT ANGIOGRAPHY HEAD AND NECK TECHNIQUE: Multidetector CT imaging of the head and neck was performed using the standard protocol during bolus administration of intravenous contrast. Multiplanar CT image reconstructions and MIPs were obtained to evaluate the vascular anatomy. Carotid stenosis measurements (when applicable) are obtained utilizing NASCET criteria, using the distal internal carotid diameter as the denominator. CONTRAST:  12mL OMNIPAQUE IOHEXOL 350 MG/ML SOLN COMPARISON:  Prior head CT from earlier the same day. FINDINGS: CTA  NECK FINDINGS Aortic arch: Visualized aortic arch of normal caliber with normal branch pattern. Moderate to advanced atheromatous change seen about the aortic arch and origin of the great vessels short-segment approximate 65% stenosis noted at the origin of the left subclavian artery (series 8, image 311). Additional extensive atheromatous change throughout the visualized subclavian arteries without high-grade stenosis. No other hemodynamically significant stenosis seen about the origin of the great vessels. Right carotid system: Scattered atheromatous change throughout the right common and internal carotid arteries without hemodynamically significant stenosis. Mild atheromatous change about the right carotid bifurcation without significant stenosis. Left carotid system: Scattered atheromatous change throughout the left CCA without high-grade stenosis. Scattered eccentric calcified plaque about the left bifurcation/proximal left ICA without significant stenosis. Left ICA widely patent distally to the skull base without stenosis, dissection or occlusion. Vertebral arteries: Both vertebral arteries arise from the subclavian arteries. Vertebral arteries widely patent without stenosis, dissection or occlusion. Skeleton: No acute osseous abnormality. No discrete or worrisome osseous lesions. 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 Other neck: No other acute soft tissue abnormality within the neck. No mass lesion or adenopathy. Upper chest: Extensive paraseptal and centrilobular emphysematous changes noted within the visualized lungs. Associated irregular biapical pleuroparenchymal thickening and/or scarring. Visualized upper chest demonstrates no other acute finding. Review of the MIP images confirms the above findings CTA HEAD FINDINGS Anterior circulation: Petrous segments widely patent bilaterally. Scattered atheromatous plaque within the cavernous/supraclinoid ICAs without hemodynamically  significant stenosis. A1 segments widely patent. Right A1 hypoplastic, accounting for the slightly diminutive right ICA is compared to the left. Normal anterior communicating artery complex. Anterior cerebral arteries widely patent to their  distal aspects without stenosis. No M1 stenosis or occlusion. Normal MCA bifurcations. Distal MCA branches well perfused and symmetric. Posterior circulation: Both vertebral arteries widely patent to the vertebrobasilar junction without stenosis. Both picas patent. Basilar widely patent to its distal aspect without stenosis. Superior cerebral arteries patent bilaterally. Right PCA primarily supplied via the basilar. Left PCA supplied via a hypoplastic left P1 segment and robust left posterior communicating artery. Both PCAs well perfused to their distal aspects without stenosis. Venous sinuses: Patent. Anatomic variants: Hypoplastic right A1 and left P1 segments. No intracranial aneurysm or other vascular abnormality. Review of the MIP images confirms the above findings IMPRESSION: 1. Negative CTA for large vessel occlusion. 2. Moderate atheromatous change about the major arterial vasculature of the head and neck, most pronounced at the aortic arch. Associated short-segment 65% stenosis at the origin of the left subclavian artery. No other hemodynamically significant or correctable stenosis identified. 3.  Emphysema (ICD10-J43.9). Electronically Signed   By: Jeannine Boga M.D.   On: 05/18/2019 21:01   CT Head Wo Contrast  Result Date: 05/18/2019 CLINICAL DATA:  Fall, head trauma EXAM: CT HEAD WITHOUT CONTRAST TECHNIQUE: Contiguous axial images were obtained from the base of the skull through the vertex without intravenous contrast. COMPARISON:  11/13/2016 FINDINGS: Brain: No acute intracranial abnormality. Specifically, no hemorrhage, hydrocephalus, mass lesion, acute infarction, or significant intracranial injury. Vascular: No hyperdense vessel or unexpected  calcification. Skull: No acute calvarial abnormality. Sinuses/Orbits: Visualized paranasal sinuses and mastoids clear. Orbital soft tissues unremarkable. Other: None IMPRESSION: Unremarkable study. Electronically Signed   By: Rolm Baptise M.D.   On: 05/18/2019 18:28   CT Angio Neck W and/or Wo Contrast  Result Date: 05/18/2019 CLINICAL DATA:  Initial evaluation for acute speech difficulty, decreased sensation in left upper extremity. EXAM: CT ANGIOGRAPHY HEAD AND NECK TECHNIQUE: Multidetector CT imaging of the head and neck was performed using the standard protocol during bolus administration of intravenous contrast. Multiplanar CT image reconstructions and MIPs were obtained to evaluate the vascular anatomy. Carotid stenosis measurements (when applicable) are obtained utilizing NASCET criteria, using the distal internal carotid diameter as the denominator. CONTRAST:  67mL OMNIPAQUE IOHEXOL 350 MG/ML SOLN COMPARISON:  Prior head CT from earlier the same day. FINDINGS: CTA NECK FINDINGS Aortic arch: Visualized aortic arch of normal caliber with normal branch pattern. Moderate to advanced atheromatous change seen about the aortic arch and origin of the great vessels short-segment approximate 65% stenosis noted at the origin of the left subclavian artery (series 8, image 311). Additional extensive atheromatous change throughout the visualized subclavian arteries without high-grade stenosis. No other hemodynamically significant stenosis seen about the origin of the great vessels. Right carotid system: Scattered atheromatous change throughout the right common and internal carotid arteries without hemodynamically significant stenosis. Mild atheromatous change about the right carotid bifurcation without significant stenosis. Left carotid system: Scattered atheromatous change throughout the left CCA without high-grade stenosis. Scattered eccentric calcified plaque about the left bifurcation/proximal left ICA without  significant stenosis. Left ICA widely patent distally to the skull base without stenosis, dissection or occlusion. Vertebral arteries: Both vertebral arteries arise from the subclavian arteries. Vertebral arteries widely patent without stenosis, dissection or occlusion. Skeleton: No acute osseous abnormality. No discrete or worrisome osseous lesions. 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 Other neck: No other acute soft tissue abnormality within the neck. No mass lesion or adenopathy. Upper chest: Extensive paraseptal and centrilobular emphysematous changes noted within the visualized lungs. Associated irregular biapical pleuroparenchymal  thickening and/or scarring. Visualized upper chest demonstrates no other acute finding. Review of the MIP images confirms the above findings CTA HEAD FINDINGS Anterior circulation: Petrous segments widely patent bilaterally. Scattered atheromatous plaque within the cavernous/supraclinoid ICAs without hemodynamically significant stenosis. A1 segments widely patent. Right A1 hypoplastic, accounting for the slightly diminutive right ICA is compared to the left. Normal anterior communicating artery complex. Anterior cerebral arteries widely patent to their distal aspects without stenosis. No M1 stenosis or occlusion. Normal MCA bifurcations. Distal MCA branches well perfused and symmetric. Posterior circulation: Both vertebral arteries widely patent to the vertebrobasilar junction without stenosis. Both picas patent. Basilar widely patent to its distal aspect without stenosis. Superior cerebral arteries patent bilaterally. Right PCA primarily supplied via the basilar. Left PCA supplied via a hypoplastic left P1 segment and robust left posterior communicating artery. Both PCAs well perfused to their distal aspects without stenosis. Venous sinuses: Patent. Anatomic variants: Hypoplastic right A1 and left P1 segments. No intracranial aneurysm or other vascular  abnormality. Review of the MIP images confirms the above findings IMPRESSION: 1. Negative CTA for large vessel occlusion. 2. Moderate atheromatous change about the major arterial vasculature of the head and neck, most pronounced at the aortic arch. Associated short-segment 65% stenosis at the origin of the left subclavian artery. No other hemodynamically significant or correctable stenosis identified. 3.  Emphysema (ICD10-J43.9). Electronically Signed   By: Jeannine Boga M.D.   On: 05/18/2019 21:01   CT Cervical Spine Wo Contrast  Result Date: 05/18/2019 CLINICAL DATA:  Fall EXAM: CT CERVICAL SPINE WITHOUT CONTRAST TECHNIQUE: Multidetector CT imaging of the cervical spine was performed without intravenous contrast. Multiplanar CT image reconstructions were also generated. COMPARISON:  None. FINDINGS: Alignment: Normal alignment. Skull base and vertebrae: No acute fracture. No primary bone lesion or focal pathologic process. Soft tissues and spinal canal: No prevertebral fluid or swelling. No visible canal hematoma. 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 Degenerative disc and facet disease. Upper chest: Biapical scarring. 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 No acute bony abnormality. Electronically Signed   By: Rolm Baptise M.D.   On: 05/18/2019 18:29   MR Brain Wo Contrast (neuro protocol)  Result Date: 05/18/2019 CLINICAL DATA:  79 year old female status post fall with head trauma, abnormal speech and left arm sensation subsequently. EXAM: MRI HEAD WITHOUT CONTRAST TECHNIQUE: Multiplanar, multiecho pulse sequences of the brain and surrounding structures were obtained without intravenous contrast. COMPARISON:  CT head and CTA head and neck earlier today. FINDINGS: Brain: There is a subtle 4-5 mm focus of restricted diffusion in the right posterior frontal lobe white matter, posterior right corona  radiata (series 7, image 51). No T2 or FLAIR hyperintensity, and no evidence of blood products at this site. No other restricted diffusion. There is a punctate chronic microhemorrhage in the left parietal lobe (series 12, image 30). But elsewhere normal for age gray and white matter signal throughout the brain. Normal cerebral volume for age. No midline shift, mass effect, evidence of mass lesion, ventriculomegaly, extra-axial collection or acute intracranial hemorrhage. Cervicomedullary junction and pituitary are within normal limits. Vascular: Major intracranial vascular flow voids are preserved. Mild ectasia of the left ICA siphon. Skull and upper cervical spine: Partially visible cervical vertebral ankylosis or congenital incomplete segmentation at C3-C4. Partially visible C4-C5 degenerative spinal stenosis. There is a small nonspecific but circumscribed and probably benign right frontal bone lesion visible on DWI series 5, image  93. Other visible bone marrow signal is within normal limits. Sinuses/Orbits: Postoperative changes to both globes. Paranasal sinuses and mastoids are stable and well pneumatized. Other: Grossly normal visible internal auditory structures. Scalp and face soft tissues appear negative. IMPRESSION: 1. Subtle 4-5 mm focus of restricted diffusion in the posterior right corona radiata appears more likely to be a small acute white matter lacunar infarct than posttraumatic shear injury. No associated hemorrhage or mass effect. 2. Elsewhere negative for age noncontrast MRI appearance of the brain. 3. 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 Electronically Signed   By: Genevie Ann M.D.   On: 05/18/2019 21:27   DG Pelvis Portable  Result Date: 05/19/2019 CLINICAL DATA:  Stroke, fall EXAM: PORTABLE PELVIS 1-2 VIEWS COMPARISON:  None. FINDINGS: Contrast within the urinary bladder and distal ureters. This obscures the sacrum. SI joints grossly non  widened. Pubic symphysis and rami appear intact. Both femoral heads project in joint. No definitive fracture seen. IMPRESSION: Contrast obscures the sacrum. Otherwise no definite acute osseous abnormality is seen Electronically Signed   By: Donavan Foil M.D.   On: 05/19/2019 01:01   DG Chest Portable 1 View  Result Date: 05/19/2019 CLINICAL DATA:  Stroke EXAM: PORTABLE CHEST 1 VIEW COMPARISON:  10/21/2018 FINDINGS: Hyperinflation with emphysematous disease. No consolidation or pleural effusion. Normal heart size with aortic atherosclerosis. No pneumothorax. Asymmetrically dense left hilus with questionable distortion. IMPRESSION: 1. Emphysematous disease without acute airspace disease 2. Slight asymmetrically dense left hilus with questionable distortion. Chest CT follow-up recommended to exclude mass or adenopathy. Electronically Signed   By: Donavan Foil M.D.   On: 05/19/2019 01:03    Assessment: 79 y.o. female presenting with report of 3 minute episode of transient right sided weakness after 2 falls at home. EMS felt that they observed some left sided weakness.  1. Patient not cooperative with exam. In this context, no gross focal deficit is appreciated.  2.  MRI brain obtained in the ED on Monday night was read as positive for a small stroke by Radiology; however, on personal review of the images by Neurology, there is a punctate, bland right corona radiata DWI hyperintensity that appears more consistent with artifact than an acute ischemic infarction.  3. Negative CTA for large vessel occlusion. There is moderate atheromatous change about the major arterial vasculature of the head and neck, most pronounced at the aortic arch. Associated short-segment 65% stenosis at the origin of the left subclavian artery. No other hemodynamically significant or correctable stenosis identified.  4. Stroke Risk Factors - Hypercholesterolemia  Recommendations: 1. HgbA1c, fasting lipid panel 2. TTE 3. Cardiac  telemetry 4. Agree with starting ASA.  5. PT consult, OT consult, Speech consult 6. IV hydration 7. Monitor CBG  @Electronically  signed: Dr. Kerney Elbe  05/19/2019, 3:58 AM

## 2019-05-19 NOTE — Progress Notes (Signed)
PT Treatment Note:  SATURATION QUALIFICATIONS: (This note is used to comply with regulatory documentation for home oxygen)  Patient Saturations on Room Air at Rest = 91%  Patient Saturations on Room Air while Ambulating = 83%  Patient Saturations on 2 Liters of oxygen while Ambulating = 86%  Please briefly explain why patient needs home oxygen: Pt with significant O2 desat on RA with SOB and fatigue noted as well as pallor. O2 sats increased with application of supplemental O2.   [name], [place]  Pager [phone] Office [phone]

## 2019-05-20 LAB — COMPREHENSIVE METABOLIC PANEL
ALT: 10 U/L (ref 0–44)
AST: 45 U/L — ABNORMAL HIGH (ref 15–41)
Albumin: 3 g/dL — ABNORMAL LOW (ref 3.5–5.0)
Alkaline Phosphatase: 32 U/L — ABNORMAL LOW (ref 38–126)
Anion gap: 7 (ref 5–15)
BUN: 12 mg/dL (ref 8–23)
CO2: 23 mmol/L (ref 22–32)
Calcium: 8 mg/dL — ABNORMAL LOW (ref 8.9–10.3)
Chloride: 110 mmol/L (ref 98–111)
Creatinine, Ser: 0.82 mg/dL (ref 0.44–1.00)
GFR calc Af Amer: >60 mL/min (ref >60)
GFR calc non Af Amer: >60 mL/min (ref >60)
Glucose, Bld: 83 mg/dL (ref 70–99)
Potassium: 5.1 mmol/L (ref 3.5–5.1)
Sodium: 140 mmol/L (ref 135–145)
Total Bilirubin: 0.4 mg/dL (ref 0.3–1.2)
Total Protein: 5 g/dL — ABNORMAL LOW (ref 6.5–8.1)

## 2019-05-20 LAB — CBC
HCT: 37 % (ref 36.0–46.0)
Hemoglobin: 11.5 g/dL — ABNORMAL LOW (ref 12.0–15.0)
MCH: 31.9 pg (ref 26.0–34.0)
MCHC: 31.1 g/dL (ref 30.0–36.0)
MCV: 102.5 fL — ABNORMAL HIGH (ref 80.0–100.0)
Platelets: 144 10*3/uL — ABNORMAL LOW (ref 150–400)
RBC: 3.61 MIL/uL — ABNORMAL LOW (ref 3.87–5.11)
RDW: 13.2 % (ref 11.5–15.5)
WBC: 6.4 10*3/uL (ref 4.0–10.5)
nRBC: 0 % (ref 0.0–0.2)

## 2019-05-20 LAB — GLUCOSE, CAPILLARY
Glucose-Capillary: 102 mg/dL — ABNORMAL HIGH (ref 70–99)
Glucose-Capillary: 113 mg/dL — ABNORMAL HIGH (ref 70–99)
Glucose-Capillary: 121 mg/dL — ABNORMAL HIGH (ref 70–99)
Glucose-Capillary: 144 mg/dL — ABNORMAL HIGH (ref 70–99)
Glucose-Capillary: 68 mg/dL — ABNORMAL LOW (ref 70–99)
Glucose-Capillary: 71 mg/dL (ref 70–99)
Glucose-Capillary: 77 mg/dL (ref 70–99)
Glucose-Capillary: 81 mg/dL (ref 70–99)

## 2019-05-20 LAB — INSULIN, RANDOM: Insulin: 16.3 u[IU]/mL (ref 2.6–24.9)

## 2019-05-20 LAB — HEMOGLOBIN A1C
Hgb A1c MFr Bld: 5.1 % (ref 4.8–5.6)
Mean Plasma Glucose: 100 mg/dL

## 2019-05-20 LAB — C-PEPTIDE: C-Peptide: 2.7 ng/mL (ref 1.1–4.4)

## 2019-05-20 MED ORDER — MIDODRINE HCL 5 MG PO TABS
5.0000 mg | ORAL_TABLET | Freq: Three times a day (TID) | ORAL | Status: DC
  Administered 2019-05-20 – 2019-05-21 (×4): 5 mg via ORAL
  Filled 2019-05-20 (×4): qty 1

## 2019-05-20 NOTE — TOC Initial Note (Signed)
Transition of Care [facility]) - Initial/Assessment Note    Patient Details  Name: Sarah Rojas MRN: [id number] Date of Birth: January 18, 1940  Transition of Care [facility]) CM/SW Contact:    Pollie Friar, RN Phone Number: 05/20/2019, 2:52 PM  Clinical Narrative:                 Recommendations are for [facility] services. Choice provided and [place] selected. Britney accepted the referral.  Pt will need home oxygen. Pt and daughter in agreement with using [employer]. Zack with [employer] will have arranged for potential d/c in am. Daughter to provide transport home and supervision at home.   Expected Discharge Plan: [place] Barriers to Discharge: Continued Medical Work up   Patient Goals and CMS Choice   CMS Medicare.gov Compare Post Acute Care list provided to:: Patient Represenative (must comment) Choice offered to / list presented to : Patient, Adult Children  Expected Discharge Plan and Services Expected Discharge Plan: [place]   Discharge Planning Services: CM Consult Post Acute Care Choice: Home Health, Durable Medical Equipment Living arrangements for the past 2 months: Single Family Home                 DME Arranged: Oxygen DME Agency: [employer] Date DME Agency Contacted: 05/20/19   Representative spoke with at DME Agency: [place]: RN, PT [place] Agency: [facility] [place] Date [place]: 05/20/19   Representative spoke with at [place]: [place]  Prior Living Arrangements/Services Living arrangements for the past 2 months: [place] with:: Self Patient language and need for interpreter reviewed:: Yes Do you feel safe going back to the place where you live?: Yes      Need for Family Participation in Patient Care: Yes (Comment) Care giver support system in place?: Yes (comment)(daughter to provide 24 hour supervision) Current home services: DME(walker, 3 in 1, wheelchair, cane) Criminal Activity/Legal  Involvement Pertinent to Current Situation/Hospitalization: No - Comment as needed  Activities of Daily Living Home Assistive Devices/Equipment: None ADL Screening (condition at time of admission) Patient's cognitive ability adequate to safely complete daily activities?: Yes Is the patient deaf or have difficulty hearing?: No Does the patient have difficulty seeing, even when wearing glasses/contacts?: No Does the patient have difficulty concentrating, remembering, or making decisions?: No Patient able to express need for assistance with ADLs?: Yes Does the patient have difficulty dressing or bathing?: No Independently performs ADLs?: Yes (appropriate for developmental age) Does the patient have difficulty walking or climbing stairs?: No Weakness of Legs: Both Weakness of Arms/Hands: None  Permission Sought/Granted                  Emotional Assessment Appearance:: Appears stated age Attitude/Demeanor/Rapport: Engaged Affect (typically observed): Accepting Orientation: : Oriented to Self, Oriented to Place, Oriented to  Time, Oriented to Situation Alcohol / Substance Use: Tobacco Use Psych Involvement: No (comment)  Admission diagnosis:  Dehydration [E86.0] Weakness [R53.1] Fall [W19.XXXA] Stroke (cerebrum) ([place]) [I63.9] Acute CVA (cerebrovascular accident) [facility]) [I63.9] Patient Active Problem List   Diagnosis Date Noted  . Acute CVA (cerebrovascular accident) ([place]) 05/18/2019  . Hypoglycemia 05/18/2019  . Tobacco abuse 05/18/2019   PCP:  Brand Males, MD Pharmacy:   [facility] [place] [place] - [address] [place] [address] Phone: [phone] Fax: [phone]     Social Determinants of Health (SDOH) Interventions    Readmission Risk Interventions  No flowsheet data found.

## 2019-05-20 NOTE — Progress Notes (Signed)
Physical Therapy Treatment Note  Patient seen for mobility progression. Pt requires min A for sit to stand transfers and grossly mod A for gait training due to impaired balance and significant gait deviations. Therapist utilized gait belt to prevent falls as pt has staggering and at times scissoring gait pattern. Pt will benefit from further skilled PT services to maximize independence and safety with mobility and will need 24 hour assistance upon d/c home.    05/20/19 0951  PT Visit Information  Last PT Received On 05/20/19  Assistance Needed +1  History of Present Illness Sarah Rojas is a 79 y.o. female with history of tobacco abuse and possible COPD had 2 syncopal episodes in 24 hours.  After second one pt's daughter found her on the floor with right sided facial droop along with some slurred speech and left-sided weakness.  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   Subjective Data  Patient Stated Goal go home  Precautions  Precautions Fall  Restrictions  Weight Bearing Restrictions No  Pain Assessment  Pain Assessment No/denies pain  Cognition  Arousal/Alertness Awake/alert  Behavior During Therapy Flat affect  Overall Cognitive Status No family/caregiver present to determine baseline cognitive functioning  Area of Impairment Memory;Safety/judgement;Problem solving  Memory Decreased short-term memory  Safety/Judgement Decreased awareness of deficits;Decreased awareness of safety  Problem Solving Difficulty sequencing;Requires verbal cues;Slow processing;Requires tactile cues;Decreased initiation  General Comments pt with decreased awareness of deficits and repeating questions during session; pt educated on reason for thickened liquids however appears that she may not understand fully    Bed Mobility  General bed mobility comments pt OOB in chair upon arrival  Transfers  Overall transfer level Needs assistance  Transfers Sit to/from Stand  Sit to Stand Min  assist  General transfer comment pt stood impulsively from recliner and required assist to gain balance upon standing  Ambulation/Gait  Ambulation/Gait assistance Min assist;Mod assist  Gait Distance (Feet) 120 Feet  Assistive device  (assist at trunk with gait belt)  Gait Pattern/deviations Narrow base of support;Staggering left;Staggering right;Step-through pattern;Decreased stride length;Scissoring  General Gait Details therapist reliant on gait belt to prevent falls while ambulating; pt demonstrates some R side inattention while in hallway; pt with scissoring at times and staggering gait; cues to stay task focused in distracting environment and cues to stop at times to regain balance as pt appears to have little awareness of balance deficits  Gait velocity decreased  Modified Rankin (Stroke Patients Only)  Pre-Morbid Rankin Score 1  Modified Rankin 4  Balance  Overall balance assessment Needs assistance  Sitting-balance support Feet supported;No upper extremity supported  Sitting balance-Leahy Scale Fair  Standing balance support No upper extremity supported  Standing balance-Leahy Scale Poor  PT - End of Session  Equipment Utilized During Treatment Gait belt;Oxygen (2L)  Activity Tolerance Patient tolerated treatment well  Patient left with call bell/phone within reach;in chair;with chair alarm set  Nurse Communication Mobility status   PT - Assessment/Plan  PT Plan Current plan remains appropriate  PT Visit Diagnosis Unsteadiness on feet (R26.81);Repeated falls (R29.6);Difficulty in walking, not elsewhere classified (R26.2);Pain  Pain - Right/Left  (generalized)  Pain - part of body  (generalized)  PT Frequency (ACUTE ONLY) Min 4X/week  Follow Up Recommendations Home health PT;Supervision/Assistance - 24 hour  PT equipment None recommended by PT  AM-PAC PT "6 Clicks" Mobility Outcome Measure (Version 2)  Help needed turning from your back to your side while in a flat bed  without using bedrails? 3  Help needed moving from lying on your back to sitting on the side of a flat bed without using bedrails? 3  Help needed moving to and from a bed to a chair (including a wheelchair)? 3  Help needed standing up from a chair using your arms (e.g., wheelchair or bedside chair)? 3  Help needed to walk in hospital room? 2  Help needed climbing 3-5 steps with a railing?  1  6 Click Score 15  Consider Recommendation of Discharge To: CIR/SNF/LTACH  PT Goal Progression  Progress towards PT goals Progressing toward goals  PT Time Calculation  PT Start Time (ACUTE ONLY) 0919  PT Stop Time (ACUTE ONLY) 0937  PT Time Calculation (min) (ACUTE ONLY) 18 min  PT General Charges  $$ ACUTE PT VISIT 1 Visit  PT Treatments  $Gait Training 8-22 mins   Earney Navy, PTA Acute Rehabilitation Services Pager: [phone] Office: [phone]

## 2019-05-20 NOTE — Progress Notes (Signed)
PROGRESS NOTE  Sarah Rojas  DOB: October 05, 1940  PCP: Brand Males, MD [id number]  DOA: 05/18/2019  LOS: 2 days   Chief Complaint  Patient presents with  . Weakness  . Aphasia   Brief narrative: Sarah Rojas is an 79 y.o. female with a PMHx of hypercholesterolemia, chronic everyday smoker 1 pack/day.  Patient was brought to the ED on 5/17 after a fall at home.  The previously on 5/16, patient failed and hit her head with LOC, but does not know how long she was unconscious.  Again the next morning, she fell and hit her head again.  Family noted her to have some slurred speech, aphasia, right facial droop and right eye droop in addition to generalized weakness.  EMS noted weakness on the opposite side as the daughter: "some left sided upper extremity weakness and sensation changes". Patient reports that she "just lost vision and blacked out".  In the ED, her BP was 108/53.  In the ED, patient also was hypoglycemic.   She was admitted to hospitalist service. Neurology consultation was obtained. MRI brain on admission was read as positive for a small stroke by Radiology; however, onreview of the images by Neurology, there is a punctate, bland right corona radiata DWI hyperintensity that appears more consistent with artifact than an acute ischemic infarction.   Subjective: Patient was seen and examined this morning.  Pleasant, elderly Caucasian female.  Looks underweight.  On low-flow oxygen by nasal cannula.  Not on supplemental oxygen at home. Daughter at bedside.  I reviewed her chart.  Noted that patient is on Valium 5 mg daily 3 times daily as needed.  Patient denies overusing her medications.  Assessment/Plan: Syncope/falls -Presented with 2 episodes of syncope at home.   -MRI findings consistent with artifact and not acute stroke per neurology -Neurological symptoms may have been secondary to hypoglycemia and hypotension -Nonfocal exam.  Hypoglycemia and  hypotension -Cause of hypotension and hypoglycemia is unclear. -Again I wonder if patient was overdoing her as needed Valium at home. -recurrent hypoglycemia on admission, appears to have resolved now.  C-peptide and proinsulin level normal.  Switch from every 2 hours fingerstick to ACHS. -Blood pressure improving on midodrine.  blood pressure still in the 90s, will give a saline bolus x1 now and start normal saline at 100 mL an hour for 24 hours. -Also trial of low-dose midodrine -Echocardiogram with EF 65 to 70%, mild LVH, age-related delayed relaxation of left ventricle.  COPD Heavy tobacco abuse -Counseled, no wheezing at this time, monitor  Lung nodules -Needs follow-up CT chest in 6 months  Atherosclerotic arterial disease -Involving aorta and potentially coronaries, noted on CT chest -aspirin 325 mg daily added.  LDL 85, HDL 57. -Counseled for smoking cessation  Dysphagia -SLP following, thickened liq diet for now, advance as tol  Mobility: Encourage ambulation Code Status:  Full code  DVT prophylaxis:  SCDs Antimicrobials:  None Fluid: None Diet: Regular diet  Consultants: Neurology signed off Family Communication:  Daughter at bedside  Status is: Inpatient  Remains inpatient appropriate because:Hemodynamically unstable   Dispo: The patient is from: Home              Anticipated d/c is to: Home              Anticipated d/c date is: 1 day              Patient currently is not medically stable to d/c.  Antimicrobials: Anti-infectives (From admission, onward)   None        Code Status: Full Code   Diet Order            Diet regular Room service appropriate? Yes with Assist; Fluid consistency: Thin  Diet effective now              Infusions:    Scheduled Meds: . aspirin EC  325 mg Oral Daily  . enoxaparin (LOVENOX) injection  30 mg Subcutaneous Q24H  . midodrine  5 mg Oral TID WC  . multivitamin with minerals  1 tablet  Oral Daily  . rosuvastatin  10 mg Oral q1800    PRN meds: acetaminophen **OR** acetaminophen (TYLENOL) oral liquid 160 mg/5 mL **OR** acetaminophen, Resource ThickenUp Clear   Objective: Vitals:   05/20/19 1124 05/20/19 1712  BP: (!) 100/49 (!) 108/47  Pulse: 61 69  Resp: 18 (!) 22  Temp: 99.2 F (37.3 C) 98.7 F (37.1 C)  SpO2: 100% 96%    Intake/Output Summary (Last 24 hours) at 05/20/2019 1723 Last data filed at 05/20/2019 1338 Gross per 24 hour  Intake 600 ml  Output 450 ml  Net 150 ml   Filed Weights   05/18/19 1700  Weight: 44.5 kg   Weight change:  Body mass index is 18.52 kg/m.   Physical Exam: General exam: Appears calm and comfortable.  Skin: No rashes, lesions or ulcers. HEENT: Atraumatic, normocephalic, supple neck, no obvious bleeding Lungs: Clear to auscultation bilaterally CVS: Regular rate and rhythm, no murmur GI/Abd soft, nontender, nondistended, bowel sound present CNS: Alert, awake, oriented x3 Psychiatry: Mood appropriate Extremities: No pedal edema, no calf tenderness  Data Review: I have personally reviewed the laboratory data and studies available.  Recent Labs  Lab 05/18/19 1738 05/18/19 1756 05/18/19 2235 05/19/19 0527 05/20/19 0407  WBC 7.7  --  6.1 6.3 6.4  NEUTROABS 5.7  --   --  4.5  --   HGB 12.3 12.2 12.5 11.4* 11.5*  HCT 38.3 36.0 39.8 35.7* 37.0  MCV 100.0  --  101.5* 101.1* 102.5*  PLT 163  --  177 165 144*   Recent Labs  Lab 05/18/19 1738 05/18/19 1756 05/18/19 2235 05/19/19 0527 05/20/19 0407  NA 140 140  --  144 140  K 4.0 3.9  --  3.9 5.1  CL 107 109  --  111 110  CO2 22  --   --  24 23  GLUCOSE 77 69*  --  79 83  BUN 20 23  --  18 12  CREATININE 1.28* 1.20* 1.22* 1.08* 0.82  CALCIUM 8.8*  --   --  7.8* 8.0*    Signed, Terrilee Croak, MD [facility] Pager: [phone] (Secure Chat preferred). 05/20/2019

## 2019-05-20 NOTE — Evaluation (Signed)
Speech Language Pathology Evaluation Patient Details Name: Sarah Rojas MRN: [id number] DOB: October 15, 1940 Today's Date: 05/20/2019 Time: [id number] SLP Time Calculation (min) (ACUTE ONLY): 30 min  Problem List:  Patient Active Problem List   Diagnosis Date Noted  . Acute CVA (cerebrovascular accident) ([place]) 05/18/2019  . Hypoglycemia 05/18/2019  . Tobacco abuse 05/18/2019   Past Medical History:  Past Medical History:  Diagnosis Date  . High cholesterol    Past Surgical History:  Past Surgical History:  Procedure Laterality Date  . ABDOMINAL HYSTERECTOMY     HPI:  Pt is a 79 yo female adm to [facility] with fall and head trauma, right facial droop and ? change to left arm sensation/strength.  Pt PMH + for smoking, prior cervical fusion C5-C6, bacterial pna possibly.  Pt imaging showed decrease diffusion posterior right corona radiata, cervical spine ankylosis with C4-C5, degenerative spinal stenosis.  Pt did not pass [facility] and her CXR showed emphysema.  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   Assessment / Plan / Recommendation Clinical Impression  Pt participated in speech/language/cognition evaluation. She denied any baseline deficits in speech, language, or cognition or any acute changes. With the pt's permission the pt's daughter, Collie Siad, was contacted via phone and she reported acute changes in processing speed and memory. The [facility] Mental Status Examination was completed to evaluate the pt's cognitive-linguistic skills. She achieved a score of 7/30 which is below the normal limits of 25 or more out of 30 for an individual who has not completed high school. She exhibited difficulty in the areas of awareness, attention, reasoning, memory, mental manipulation, executive function, and complex problem solving. Skilled SLP services are clinically indicated at this time to improve cognitive-linguistic function.    SLP Assessment  SLP  Recommendation/Assessment: Patient needs continued Speech Lanaguage Pathology Services SLP Visit Diagnosis: Cognitive communication deficit (R41.841)    Follow Up Recommendations  24 hour supervision/assistance;Home health SLP    Frequency and Duration min 2x/week  2 weeks      SLP Evaluation Cognition  Overall Cognitive Status: Impaired/Different from baseline Arousal/Alertness: Awake/alert Orientation Level: Oriented to person;Oriented to place;Oriented to situation;Disoriented to time Attention: Focused;Sustained Focused Attention: Impaired Focused Attention Impairment: Verbal complex Sustained Attention: Impaired Sustained Attention Impairment: Verbal complex Memory: Impaired Memory Impairment: Storage deficit;Retrieval deficit;Decreased recall of new information(Immediate: 4/5; delayed: 0/5 with cues: 1/5) Awareness: Impaired Awareness Impairment: Intellectual impairment Executive Function: Reasoning;Sequencing;Organizing Reasoning: Impaired Reasoning Impairment: Verbal complex Organizing: Impaired Organizing Impairment: Verbal complex(Backward digit span: 0/3)       Comprehension  Auditory Comprehension Overall Auditory Comprehension: Appears within functional limits for tasks assessed Yes/No Questions: Within Functional Limits Basic Biographical Questions: (5/5+) Complex Questions: (5/5) Paragraph Comprehension (via yes/no questions): (3/4) Commands: Impaired Two Step Basic Commands: (4/4) Multistep Basic Commands: (1/3) Interfering Components: Processing speed;Working Marine scientist;Attention Visual Recognition/Discrimination Discrimination: Within Function Limits Reading Comprehension Reading Status: Not tested    Expression Expression Primary Mode of Expression: Verbal Verbal Expression Overall Verbal Expression: Appears within functional limits for tasks assessed Initiation: No impairment Automatic Speech: Counting;Day of week;Month of year(WNL) Level of  Generative/Spontaneous Verbalization: Conversation Repetition: No impairment Naming: No impairment Pragmatics: Impairment Impairments: Abnormal affect;Eye contact Interfering Components: Attention   Oral / Motor  Motor Speech Overall Motor Speech: Appears within functional limits for tasks assessed Respiration: Within functional limits Phonation: Normal Resonance: Within functional limits Articulation: Within functional limitis Intelligibility: Intelligible Motor Planning: Witnin functional limits Motor Speech Errors: Not applicable   Shanika I.  Hardin Negus, [place], [place] Office number [phone] Pager [phone]                    Horton Marshall 05/20/2019, 5:26 PM

## 2019-05-20 NOTE — Progress Notes (Signed)
  Speech Language Pathology Treatment: Dysphagia  Patient Details Name: Sarah Rojas MRN: [id number] DOB: 1940/12/08 Today's Date: 05/20/2019 Time: [id number] SLP Time Calculation (min) (ACUTE ONLY): 12 min  Assessment / Plan / Recommendation Clinical Impression  Pt was seen for dysphagia treatment and was cooperative during the session. A full nectar thick liquid diet was recommended yesterday but pt has been receiving a regular texture tray with nectar thick liquids today. Nursing reported that the pt has been consuming the current diet without  overt s/sx of aspiration but that the pt has expressed her displeasure regarding the nectar thick liquids and has not been eating much solids. Per RN, pt's daughter is hopeful that the pt's diet may be able to be upgraded since the pt's level of alertness is improved compared to yesterday. No s/sx of aspiration were noted with puree solids, regular texture solids, or consecutive swallows of thin liquids. Mastication time was [facility] considering her reduced dentition. Pt's maxillary dentures were at bedside but pt stated that she does not always eat with then and requested that they not be put in. No significant oral residue was noted. It is recommended that her diet be advanced to regular texture solids with thin liquids. SLP will follow to ensure safety of the recommended diet.    HPI HPI: Pt is a 79 yo female adm to [facility] with fall and head trauma, right facial droop and ? change to left arm sensation/strength.  Pt PMH + for smoking, prior cervical fusion C5-C6, bacterial pna possibly.  Pt imaging showed decrease diffusion posterior right corona radiata, cervical spine ankylosis with C4-C5, degenerative spinal stenosis.  Pt did not pass [facility] and her CXR showed emphysema.  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      SLP Plan  Goals updated  Patient needs continued Speech Lanaguage Pathology Services    Recommendations  Diet  recommendations: Regular;Thin liquid Liquids provided via: Cup;Teaspoon Medication Administration: Whole meds with puree Supervision: Patient able to self feed Compensations: Small sips/bites;Minimize environmental distractions;Slow rate Postural Changes and/or Swallow Maneuvers: Seated upright 90 degrees;Upright 30-60 min after meal                Oral Care Recommendations: Oral care BID Follow up Recommendations: 24 hour supervision/assistance;Home health SLP SLP Visit Diagnosis: Cognitive communication deficit [id number]) Plan: Goals updated       Shanika I. Hardin Negus, [place], [place] Office number [phone] Pager [phone]                Horton Marshall 05/20/2019, 5:17 PM

## 2019-05-20 NOTE — Evaluation (Signed)
Occupational Therapy Evaluation Patient Details Name: Sarah Rojas MRN: [id number] DOB: 1940-10-18 Today's Date: 05/20/2019    History of Present Illness KENAN VANDALE is a 79 y.o. female with history of tobacco abuse and possible COPD had 2 syncopal episodes in 24 hours.  After second one pt's daughter found her on the floor with right sided facial droop along with some slurred speech and left-sided weakness.  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    Clinical Impression   Pt admitted with the above diagnoses and presents with below problem list. Pt will benefit from continued acute OT to address the below listed deficits and maximize independence with basic ADLs prior to d/c home with daughter and family providing 24 hour assist. Pt at baseline is independent with ADLs. Pt currently min to mod A with LB ADLs and functional mobility. Very unsteady gait needing min A and utilizing gait belt. 2x LOB needing mod A to correct. Daughter present and included in education.      Follow Up Recommendations  Home health OT;Supervision/Assistance - 24 hour    Equipment Recommendations  Other (comment)(pt's daughter reports she has DME)    Recommendations for Other Services       Precautions / Restrictions Precautions Precautions: Fall Precaution Comments: very unsteady, scissoring gait. gait belt heavily utilized Restrictions Weight Bearing Restrictions: No      Mobility Bed Mobility Overal bed mobility: Needs Assistance Bed Mobility: Supine to Sit;Sit to Supine     Supine to sit: Min assist Sit to supine: Min assist   General bed mobility comments: implusive to attempt standing soon after sitting EOB. Min A for unsteadiness as pt crawling into bed as soon as she arrived close to bed at end of session  Transfers Overall transfer level: Needs assistance Equipment used: None Transfers: Sit to/from Stand Sit to Stand: Min assist         General  transfer comment: min A to steady from bed and toilet    Balance Overall balance assessment: Needs assistance Sitting-balance support: Feet supported;No upper extremity supported Sitting balance-Leahy Scale: Poor Sitting balance - Comments: mostly fair to sit EOB but LOB while leaning to the left to reach phone.    Standing balance support: No upper extremity supported Standing balance-Leahy Scale: Poor Standing balance comment: needs UE support for safety                           ADL either performed or assessed with clinical judgement   ADL Overall ADL's : Needs assistance/impaired Eating/Feeding: Supervision/ safety;Sitting;Set up Eating/Feeding Details (indicate cue type and reason): thickened liquids Grooming: Set up;Sitting   Upper Body Bathing: Set up;Sitting   Lower Body Bathing: Sit to/from stand;Minimal assistance   Upper Body Dressing : Set up;Sitting   Lower Body Dressing: Minimal assistance;Sit to/from stand   Toilet Transfer: Minimal assistance;Ambulation;Comfort height toilet Toilet Transfer Details (indicate cue type and reason): min A to steady during walk to/from bathroom and to control descent onto toilet.  Toileting- Clothing Manipulation and Hygiene: Minimal assistance;Sit to/from stand   Tub/ Shower Transfer: Tub transfer;Minimal assistance;Ambulation;Tub bench Tub/Shower Transfer Details (indicate cue type and reason): daughter endorses pt has tub bench. min A to steady Functional mobility during ADLs: Minimal assistance;Moderate assistance General ADL Comments: Pt min A for LB ADLs and to mobilize in the room. For household distance ambulation (into the halls) pt 2x needing mod A for LOB. Scissoring gait  and keeps eyes in steady forward gaze.     Vision   Vision Assessment?: Vision impaired- to be further tested in functional context Additional Comments: hold gaze in neutral while ambulating noted to display little to no head turns even  during turns.  Denied dizziness/lightheadedness.      Perception     Praxis      Pertinent Vitals/Pain Pain Assessment: Faces Faces Pain Scale: Hurts little more Pain Location: sore all over Pain Descriptors / Indicators: Grimacing;Sore Pain Intervention(s): Repositioned;Monitored during session     Hand Dominance     Extremity/Trunk Assessment Upper Extremity Assessment Upper Extremity Assessment: Generalized weakness   Lower Extremity Assessment Lower Extremity Assessment: Defer to PT evaluation   Cervical / Trunk Assessment Cervical / Trunk Assessment: Kyphotic   Communication Communication Communication: Expressive difficulties(mild slurring)   Cognition Arousal/Alertness: Awake/alert Behavior During Therapy: Flat affect;Impulsive(impulsive to stand) Overall Cognitive Status: No family/caregiver present to determine baseline cognitive functioning Area of Impairment: Memory;Safety/judgement;Problem solving                     Memory: Decreased recall of precautions;Decreased short-term memory   Safety/Judgement: Decreased awareness of deficits;Decreased awareness of safety   Problem Solving: Slow processing;Difficulty sequencing;Requires verbal cues General Comments: Decreased insight into deficits and decreased sequencing and problem solving. Impulsive to stand. Asked for water, needed reminded that she is on thickened liquids.   General Comments  on 2L throughout session with sats above 90. On RA for about a minute sitting EOB and noted to drop to 87. O2 reaaplied at 2L and sats recovered to 90    Exercises     Shoulder Instructions      Home Living Family/patient expects to be discharged to:: Private residence Living Arrangements: Alone Available Help at Discharge: Family;Available 24 hours/day Type of Home: House Home Access: Ramped entrance     Home Layout: One level     Bathroom Shower/Tub: Tub/shower unit         Home Equipment:  Environmental consultant - 2 wheels;Cane - single point;Bedside commode;Tub bench;Transport chair   Additional Comments: Daughter present and provided home setup. Daughter states she can provide 24 hour assist and has family around to assist PRN.      Prior Functioning/Environment Level of Independence: Independent        Comments: pt reports she was independent without AD and was driving and doing her own shopping but per chart pt was basically "shut in" since husband passed in 2017        OT Problem List: Decreased strength;Decreased activity tolerance;Impaired balance (sitting and/or standing);Decreased cognition;Decreased coordination;Decreased safety awareness;Decreased knowledge of use of DME or AE;Decreased knowledge of precautions;Cardiopulmonary status limiting activity;Pain      OT Treatment/Interventions: Self-care/ADL training;Therapeutic exercise;Energy conservation;DME and/or AE instruction;Therapeutic activities;Patient/family education;Balance training    OT Goals(Current goals can be found in the care plan section) Acute Rehab OT Goals Patient Stated Goal: go home OT Goal Formulation: With patient/family Time For Goal Achievement: 06/03/19 Potential to Achieve Goals: Good ADL Goals Pt Will Perform Grooming: standing;with supervision Pt Will Perform Lower Body Bathing: with min guard assist;sit to/from stand Pt Will Perform Lower Body Dressing: with min guard assist;sit to/from stand Pt Will Transfer to Toilet: with min guard assist;ambulating Pt Will Perform Toileting - Clothing Manipulation and hygiene: with min guard assist;sit to/from stand  OT Frequency: Min 2X/week   Barriers to D/C:            Co-evaluation  AM-PAC OT "6 Clicks" Daily Activity     Outcome Measure Help from another person eating meals?: A Little Help from another person taking care of personal grooming?: A Little Help from another person toileting, which includes using toliet, bedpan,  or urinal?: A Little Help from another person bathing (including washing, rinsing, drying)?: A Little Help from another person to put on and taking off regular upper body clothing?: A Little Help from another person to put on and taking off regular lower body clothing?: A Little 6 Click Score: 18   End of Session Equipment Utilized During Treatment: Gait belt;Oxygen Nurse Communication: Mobility status  Activity Tolerance: Patient limited by fatigue;Patient tolerated treatment well Patient left: in bed;with call bell/phone within reach;with bed alarm set;with family/visitor present  OT Visit Diagnosis: Unsteadiness on feet (R26.81);Other abnormalities of gait and mobility (R26.89);Muscle weakness (generalized) (M62.81);History of falling (Z91.81)                Time: 1333-1400 OT Time Calculation (min): 27 min Charges:  OT General Charges $OT Visit: 1 Visit OT Evaluation $OT Eval Moderate Complexity: 1 Mod OT Treatments $Self Care/Home Management : 8-22 mins  Tyrone Schimke, OT Acute Rehabilitation Services Pager: [phone] Office: [phone]    Hortencia Pilar 05/20/2019, 2:41 PM

## 2019-05-21 LAB — GLUCOSE, CAPILLARY
Glucose-Capillary: 83 mg/dL (ref 70–99)
Glucose-Capillary: 92 mg/dL (ref 70–99)

## 2019-05-21 MED ORDER — MIDODRINE HCL 5 MG PO TABS: 5.0000 mg | ORAL_TABLET | Freq: Three times a day (TID) | ORAL | 2 refills | Status: AC

## 2019-05-21 MED ORDER — ASPIRIN 325 MG PO TBEC: 325.0000 mg | DELAYED_RELEASE_TABLET | Freq: Every day | ORAL | 0 refills | Status: AC

## 2019-05-21 NOTE — Progress Notes (Signed)
  Speech Language Pathology Treatment: Dysphagia  Patient Details Name: Sarah Rojas MRN: [id number] DOB: Oct 24, 1940 Today's Date: 05/21/2019 Time: [id number] SLP Time Calculation (min) (ACUTE ONLY): 23 min  Assessment / Plan / Recommendation Clinical Impression  Pt was seen for treatment and was cooperative throughout the session. Pt denied overt s/sx of aspiration with the current diet. No symptoms of oropharyngeal dysphagia were noted with mixed consistency boluses (dysphagia 2 solids with thin liquids), regular texture solids, or thin liquids via straw using consecutive swallows. It is recommended that the current diet be continued. Pt required cues for details during a sequencing activity but was able to provide the accurate sequence. She required verbal prompts for reasoning during an executive function task related to her menu and meals. Further SLP services are not clinically indicated for dysphagia but SLP will continue to follow for cognition.    HPI HPI: 79 yo female adm to [facility] with fall and head trauma, right facial droop and ? change to left arm sensation/strength.  Pt PMH + for smoking, prior cervical fusion C5-C6, bacterial pna possibly.  Pt imaging showed decrease diffusion posterior right corona radiata, cervical spine ankylosis with C4-C5, degenerative spinal stenosis.  Pt did not pass [facility] and her CXR showed emphysema.  Swallow/speech evaluation ordered.      SLP Plan  Continue with current plan of care       Recommendations  Diet recommendations: Regular;Thin liquid Liquids provided via: Cup;Straw Medication Administration: Whole meds with puree Supervision: Patient able to self feed Compensations: Small sips/bites;Minimize environmental distractions;Slow rate Postural Changes and/or Swallow Maneuvers: Seated upright 90 degrees;Upright 30-60 min after meal                Oral Care Recommendations: Oral care BID Follow up Recommendations: 24 hour  supervision/assistance SLP Visit Diagnosis: Dysphagia, unspecified (R13.10);Cognitive communication deficit (R41.841) Plan: Continue with current plan of care       Joey Lierman I. Hardin Negus, [place], [place] Office number [phone] Pager [place] 05/21/2019, 12:20 PM

## 2019-05-21 NOTE — Care Management Important Message (Signed)
Important Message  Patient Details  Name: Sarah Rojas MRN: [id number] Date of Birth: Mar 13, 1940   Medicare Important Message Given:  Yes  Patient left prior to IM delivery.  IM mailed to the patient home address.    Abu Heavin 05/21/2019, 3:37 PM

## 2019-05-21 NOTE — Plan of Care (Signed)
  Problem: Education: Goal: Knowledge of disease or condition will improve Outcome: Progressing   Problem: Education: Goal: Knowledge of General Education information will improve Description: Including pain rating scale, medication(s)/side effects and non-pharmacologic comfort measures Outcome: Progressing   Problem: Health Behavior/Discharge Planning: Goal: Ability to manage health-related needs will improve Outcome: Progressing  Discussion with patient and family about need for O2.  Problem: Nutrition: Goal: Adequate nutrition will be maintained Outcome: Progressing  Patient able to tolerate meals without nausea.  Problem: Elimination: Goal: Will not experience complications related to urinary retention Outcome: Progressing  Patient up to bathroom with large amount of output 2x this shift.

## 2019-05-21 NOTE — Progress Notes (Signed)
Nsg Discharge Note  Admit Date:  05/18/2019 Discharge date: 05/21/2019   SILVESTRA KATE to be D/C'd Home per MD order.  AVS completed.   Patient/caregiver able to verbalize understanding.  Discharge Medication: Allergies as of 05/21/2019      Reactions   Latex Rash      Medication List    TAKE these medications   albuterol 108 (90 Base) MCG/ACT inhaler Commonly known as: VENTOLIN HFA Inhale 1 puff into the lungs every 4 (four) hours as needed.   aspirin 325 MG EC tablet Take 1 tablet (325 mg total) by mouth daily. Start taking on: May 22, 2019 What changed:   medication strength  how much to take Notes to patient: Tomorrow, 05/22/19.   BC HEADACHE POWDER PO Take 1 Package by mouth daily as needed (for headache).   diazepam 5 MG tablet Commonly known as: VALIUM Take 5 mg by mouth 3 (three) times daily as needed.   fluticasone 50 MCG/ACT nasal spray Commonly known as: FLONASE Place 1 spray into both nostrils daily as needed for allergies or rhinitis.   midodrine 5 MG tablet Commonly known as: PROAMATINE Take 1 tablet (5 mg total) by mouth 3 (three) times daily with meals. Notes to patient: Tonight, 05/21/19.   multivitamin with minerals Tabs tablet Take 1 tablet by mouth daily. Notes to patient: Tomorrow, 05/22/19.   rosuvastatin 5 MG tablet Commonly known as: CRESTOR Take 5 mg by mouth daily. Notes to patient: Tonight, 05/21/19.            Durable Medical Equipment  (From admission, onward)         Start     Ordered   05/21/19 1213  DME Oxygen  Once    Question Answer Comment  Length of Need Lifetime   Mode or (Route) Nasal cannula   Liters per Minute 2   Frequency Continuous (stationary and portable oxygen unit needed)   Oxygen conserving device Yes   Oxygen delivery system Gas      05/21/19 1212   05/20/19 1207  For home use only DME oxygen  Once    Question Answer Comment  Length of Need Lifetime   Mode or (Route) Nasal cannula   Liters  per Minute 3   Frequency Continuous (stationary and portable oxygen unit needed)   Oxygen conserving device Yes   Oxygen delivery system Gas      05/20/19 1206          Discharge Assessment: Vitals:   05/21/19 0807 05/21/19 0944  BP: (!) 115/92 (!) 106/42  Pulse: 66   Resp: (!) 21 17  Temp: 97.8 F (36.6 C)   SpO2: 99% 100%   Skin clean, dry and intact without evidence of skin break down, no evidence of skin tears noted. IV catheter discontinued intact. Site without signs and symptoms of complications - no redness or edema noted at insertion site, patient denies c/o pain - only slight tenderness at site.  Dressing with slight pressure applied.  D/c Instructions-Education: Discharge instructions given to patient/family with verbalized understanding. Patient and daughter continue to refuse supplemental O2 at home. D/c education completed with patient/family including follow up instructions, medication list, d/c activities limitations if indicated, with other d/c instructions as indicated by MD - patient able to verbalize understanding, all questions fully answered. Patient instructed to return to ED, call 911, or call MD for any changes in condition.  Patient escorted via [place], and D/C home via private auto.  Maicol Bowland L  Amalia Hailey, RN 05/21/2019 2:41 PM

## 2019-05-21 NOTE — Plan of Care (Signed)
Problem: Education: Goal: Knowledge of disease or condition will improve 05/21/2019 1349 by Joni Reining, RN Outcome: Adequate for Discharge 05/21/2019 1342 by Joni Reining, RN Outcome: Progressing Goal: Knowledge of secondary prevention will improve 05/21/2019 1349 by Joni Reining, RN Outcome: Adequate for Discharge 05/21/2019 1342 by Joni Reining, RN Outcome: Progressing Goal: Knowledge of patient specific risk factors addressed and post discharge goals established will improve Outcome: Adequate for Discharge Goal: Individualized Educational Video(s) Outcome: Adequate for Discharge   Problem: Coping: Goal: Will verbalize positive feelings about self Outcome: Adequate for Discharge Goal: Will identify appropriate support needs Outcome: Adequate for Discharge   Problem: Health Behavior/Discharge Planning: Goal: Ability to manage health-related needs will improve Outcome: Adequate for Discharge   Problem: Self-Care: Goal: Ability to participate in self-care as condition permits will improve Outcome: Adequate for Discharge Goal: Verbalization of feelings and concerns over difficulty with self-care will improve Outcome: Adequate for Discharge Goal: Ability to communicate needs accurately will improve Outcome: Adequate for Discharge   Problem: Nutrition: Goal: Risk of aspiration will decrease Outcome: Adequate for Discharge Goal: Dietary intake will improve Outcome: Adequate for Discharge   Problem: Ischemic Stroke/TIA Tissue Perfusion: Goal: Complications of ischemic stroke/TIA will be minimized Outcome: Adequate for Discharge   Problem: Education: Goal: Knowledge of General Education information will improve Description: Including pain rating scale, medication(s)/side effects and non-pharmacologic comfort measures 05/21/2019 1349 by Joni Reining, RN Outcome: Adequate for Discharge 05/21/2019 1342 by Joni Reining, RN Outcome: Progressing   Problem: Health  Behavior/Discharge Planning: Goal: Ability to manage health-related needs will improve 05/21/2019 1349 by Joni Reining, RN Outcome: Adequate for Discharge 05/21/2019 1342 by Joni Reining, RN Outcome: Progressing   Problem: Clinical Measurements: Goal: Ability to maintain clinical measurements within normal limits will improve Outcome: Adequate for Discharge Goal: Will remain free from infection Outcome: Adequate for Discharge Goal: Diagnostic test results will improve Outcome: Adequate for Discharge Goal: Respiratory complications will improve Outcome: Adequate for Discharge Goal: Cardiovascular complication will be avoided Outcome: Adequate for Discharge   Problem: Activity: Goal: Risk for activity intolerance will decrease Outcome: Adequate for Discharge   Problem: Nutrition: Goal: Adequate nutrition will be maintained 05/21/2019 1349 by Joni Reining, RN Outcome: Adequate for Discharge 05/21/2019 1342 by Joni Reining, RN Outcome: Progressing   Problem: Coping: Goal: Level of anxiety will decrease Outcome: Adequate for Discharge   Problem: Elimination: Goal: Will not experience complications related to bowel motility Outcome: Adequate for Discharge Goal: Will not experience complications related to urinary retention 05/21/2019 1349 by Joni Reining, RN Outcome: Adequate for Discharge 05/21/2019 1342 by Joni Reining, RN Outcome: Progressing   Problem: Pain Managment: Goal: General experience of comfort will improve Outcome: Adequate for Discharge   Problem: Safety: Goal: Ability to remain free from injury will improve Outcome: Adequate for Discharge   Problem: Skin Integrity: Goal: Risk for impaired skin integrity will decrease Outcome: Adequate for Discharge   Problem: Acute Rehab PT Goals(only PT should resolve) Goal: Pt Will Go Supine/Side To Sit Outcome: Adequate for Discharge Goal: Pt Will Go Sit To Supine/Side Outcome: Adequate for  Discharge Goal: Patient Will Transfer Sit To/From Stand Outcome: Adequate for Discharge Goal: Pt Will Ambulate Outcome: Adequate for Discharge   Problem: Acute Rehab OT Goals (only OT should resolve) Goal: Pt. Will Perform Grooming Outcome: Adequate for Discharge Goal: Pt. Will Perform Lower Body Bathing Outcome: Adequate for Discharge Goal: Pt. Will Perform Lower Body Dressing Outcome: Adequate  for Discharge Goal: Pt. Will Transfer To Toilet Outcome: Adequate for Discharge Goal: Pt. Will Perform Toileting-Clothing Manipulation Outcome: Adequate for Discharge   Problem: SLP Cognition Goals Goal: Patient will utilize external memory aids Description: Patient will utilize external memory aids to facilitate recall of information for improved safety with Outcome: Adequate for Discharge Goal: Patient will demonstrate problem solving skills Description: Patient will demonstrate problem solving skills during functional ADL's with Outcome: Adequate for Discharge Goal: Patient will demonstrate awareness during Description: Patient will demonstrate awareness during functional ADL for improved safety Outcome: Adequate for Discharge

## 2019-05-21 NOTE — TOC Transition Note (Signed)
Transition of Care [facility]) - CM/SW Discharge Note   Patient Details  Name: Sarah Rojas MRN: [id number] Date of Birth: 07-26-1940  [facility] CM/SW Contact:  Pollie Friar, RN Phone Number: 05/21/2019, 1:47 PM   Clinical Narrative:    Pt is discharging home with [facility] services through [facility]. Britney with [facility] is aware of d/c home.  Pt and daughter have decided to decline home oxygen. CM asked that PT re-walk her without oxygen and she desatted to 87%. Patient and daughter aware she qualifies and needs home oxygen but continue to refuse. Pt states she doesn't want the big oxygen equipment in her home and the daughter keeps stating she doesn't need it.  CM has updated the MD and he is ok with her discharging home.  Daughter to provide transport home.   Final next level of care: Home w Home Health Services Barriers to Discharge: No Barriers Identified   Patient Goals and CMS Choice   CMS Medicare.gov Compare Post Acute Care list provided to:: Patient Represenative (must comment) Choice offered to / list presented to : Patient, Adult Children  Discharge Placement                       Discharge Plan and Services   Discharge Planning Services: CM Consult Post Acute Care Choice: Home Health, Durable Medical Equipment          DME Arranged: Oxygen DME Agency: [employer] Date DME Agency Contacted: 05/20/19   Representative spoke with at DME Agency: [place]: RN, PT [facility] Agency: [facility] Date [place]: 05/20/19   Representative spoke with at [place]: Justice ([place]) Interventions     Readmission Risk Interventions No flowsheet data found.

## 2019-05-21 NOTE — Discharge Summary (Signed)
Physician Discharge Summary  MARLES SWINTON [id number] DOB: 1940-12-15 DOA: 05/18/2019  PCP: Brand Males, MD  Admit date: 05/18/2019 Discharge date: 05/21/2019  Admitted From: Home Discharge disposition: Home with home oxygen   Code Status: Full Code  Diet Recommendation: Regular diet   Recommendations for Outpatient Follow-Up:   1. Follow-up with PCP as an outpatient  Discharge Diagnosis:   Principal Problem:   Acute CVA (cerebrovascular accident) [facility]) Active Problems:   Hypoglycemia   Tobacco abuse    History of Present Illness / Brief narrative:  Sarah Kalbach Lunsfordis an 79 y.o.femalewith a PMHx of hypercholesterolemia, chronic everyday smoker 1 pack/day.  Patient was brought to the ED on 5/17 after a fall at home.  The previously on 5/16, patient failed and hit her head with LOC, but does not know how long she was unconscious.  Again the next morning, she fell and hit her head again.  Family noted her to have some slurred speech, aphasia, right facial droop and right eye droop in addition to generalized weakness.  EMS noted weakness on the opposite side as the daughter: "some left sided upper extremity weakness and sensation changes". Patient reports that she "just lost vision and blacked out".  In the ED, her BP was 108/53.  In the ED, patient also was hypoglycemic.   She was admitted to hospitalist service. Neurology consultation was obtained. MRI brain on admission was read as positive for a small stroke by Radiology; however, onreview of the images by Neurology, there is a punctate,bland right corona radiata DWI hyperintensity that appears more consistent with artifact than an acute ischemic infarction.  Hospital Course:  Syncope/falls -Presented with 2 episodes of syncope at home.   -MRI findings consistent with artifact and not acute stroke per neurology -Neurological symptoms may have been secondary to hypoglycemia and hypotension -Nonfocal  exam.  Hypoglycemia and hypotension -Cause of hypotension and hypoglycemia is unclear. -Again I wonder if patient was overdoing her as needed Valium at home. -recurrent hypoglycemia on admission, appears to have resolved now.  C-peptide and proinsulin level normal.  -Blood pressure improving on midodrine.  Continue low-dose midodrine at home. -Echocardiogram with EF 65 to 70%, mild LVH, age-related delayed relaxation of left ventricle.  Acute respiratory failure with hypoxia COPD Heavy tobacco abuse -Chronic daily smoker, 1 pack/day.  Counseled to quit  -Has history of COPD.  No wheezing at this time.  However patient is requiring supplemental oxygen at 2 L/min consistently.  Saturation dropped significantly on ambulation.   -Continue supplemental oxygen at home.  Lung nodules -CT chest on admission showed numerous small bibasilar pulmonary nodules likely benign.  -Recommend follow-up CT chest in 6 months  Atherosclerotic arterial disease -Involving aorta and potentially coronaries, noted on CT chest -aspirin  switched from 80 mg to 325 mg daily.Marland Kitchen  LDL 85, HDL 57. -Counseled for smoking cessation  Mobility: Encourage ambulation Code Status: Full code  Subjective:  Seen and examined this morning.  Pleasant elderly thin built female.  Not in distress.  No new symptoms.  Ready to go home today.  Discharge Exam:   Vitals:   05/21/19 0017 05/21/19 0334 05/21/19 0807 05/21/19 0944  BP: (!) 103/52 (!) 111/51 (!) 115/92 (!) 106/42  Pulse: 84 72 66   Resp: (!) 22 (!) 21 (!) 21 17  Temp: 99.3 F (37.4 C) 98.1 F (36.7 C) 97.8 F (36.6 C)   TempSrc: Oral Oral Oral   SpO2: 98% 97% 99% 100%  Weight:  Height:        Body mass index is 18.52 kg/m.  General exam: Appears calm and comfortable.  Skin: No rashes, lesions or ulcers. HEENT: Atraumatic, normocephalic, supple neck, no obvious bleeding Lungs: No wheezing, no crackles.  Diminished breath sounds both bases  bilaterally CVS: Regular rate and rhythm, no murmur GI/Abd soft, nontender, nondistended, bowel sound present CNS: Alert, awake monitor x3 Psychiatry: Mood appropriate Extremities: No pedal edema, no calf tenderness  Discharge Instructions:  Wound care: None Discharge Instructions    Diet general   Complete by: As directed    Increase activity slowly   Complete by: As directed      Follow-up Information    Brand Males, MD Follow up.   Specialty: Pulmonary Disease Why: His follow-up CT chest in 6 months Contact information: [address] [place]  [address] [phone]          Allergies as of 05/21/2019      Reactions   Latex Rash      Medication List    TAKE these medications   albuterol 108 (90 Base) MCG/ACT inhaler Commonly known as: VENTOLIN HFA Inhale 1 puff into the lungs every 4 (four) hours as needed.   aspirin 325 MG EC tablet Take 1 tablet (325 mg total) by mouth daily. Start taking on: May 22, 2019 What changed:   medication strength  how much to take   [facility] HEADACHE POWDER PO Take 1 Package by mouth daily as needed (for headache).   diazepam 5 MG tablet Commonly known as: VALIUM Take 5 mg by mouth 3 (three) times daily as needed.   fluticasone 50 MCG/ACT nasal spray Commonly known as: FLONASE Place 1 spray into both nostrils daily as needed for allergies or rhinitis.   midodrine 5 MG tablet Commonly known as: PROAMATINE Take 1 tablet (5 mg total) by mouth 3 (three) times daily with meals.   multivitamin with minerals Tabs tablet Take 1 tablet by mouth daily.   rosuvastatin 5 MG tablet Commonly known as: CRESTOR Take 5 mg by mouth daily.            Durable Medical Equipment  (From admission, onward)         Start     Ordered   05/21/19 1213  DME Oxygen  Once    Question Answer Comment  Length of Need Lifetime   Mode or (Route) Nasal cannula   Liters per Minute 2   Frequency Continuous (stationary and  portable oxygen unit needed)   Oxygen conserving device Yes   Oxygen delivery system Gas      05/21/19 1212   05/20/19 1207  For home use only DME oxygen  Once    Question Answer Comment  Length of Need Lifetime   Mode or (Route) Nasal cannula   Liters per Minute 3   Frequency Continuous (stationary and portable oxygen unit needed)   Oxygen conserving device Yes   Oxygen delivery system Gas      05/20/19 1206          Time coordinating discharge: 35 minutes  The results of significant diagnostics from this hospitalization (including imaging, microbiology, ancillary and laboratory) are listed below for reference.    Procedures and Diagnostic Studies:   CT Angio Head W/Cm &/Or Wo Cm  Result Date: 05/18/2019 CLINICAL DATA:  Initial evaluation for acute speech difficulty, decreased sensation in left upper extremity. EXAM: CT ANGIOGRAPHY HEAD AND NECK TECHNIQUE: Multidetector CT imaging of the  head and neck was performed using the standard protocol during bolus administration of intravenous contrast. Multiplanar CT image reconstructions and MIPs were obtained to evaluate the vascular anatomy. Carotid stenosis measurements (when applicable) are obtained utilizing NASCET criteria, using the distal internal carotid diameter as the denominator. CONTRAST:  49mL OMNIPAQUE IOHEXOL 350 MG/ML SOLN COMPARISON:  Prior head CT from earlier the same day. FINDINGS: CTA NECK FINDINGS Aortic arch: Visualized aortic arch of normal caliber with normal branch pattern. Moderate to advanced atheromatous change seen about the aortic arch and origin of the great vessels short-segment approximate 65% stenosis noted at the origin of the left subclavian artery (series 8, image 311). Additional extensive atheromatous change throughout the visualized subclavian arteries without high-grade stenosis. No other hemodynamically significant stenosis seen about the origin of the great vessels. Right carotid system: Scattered  atheromatous change throughout the right common and internal carotid arteries without hemodynamically significant stenosis. Mild atheromatous change about the right carotid bifurcation without significant stenosis. Left carotid system: Scattered atheromatous change throughout the left CCA without high-grade stenosis. Scattered eccentric calcified plaque about the left bifurcation/proximal left ICA without significant stenosis. Left ICA widely patent distally to the skull base without stenosis, dissection or occlusion. Vertebral arteries: Both vertebral arteries arise from the subclavian arteries. Vertebral arteries widely patent without stenosis, dissection or occlusion. Skeleton: No acute osseous abnormality. No discrete or worrisome osseous lesions. 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 Other neck: No other acute soft tissue abnormality within the neck. No mass lesion or adenopathy. Upper chest: Extensive paraseptal and centrilobular emphysematous changes noted within the visualized lungs. Associated irregular biapical pleuroparenchymal thickening and/or scarring. Visualized upper chest demonstrates no other acute finding. Review of the MIP images confirms the above findings CTA HEAD FINDINGS Anterior circulation: Petrous segments widely patent bilaterally. Scattered atheromatous plaque within the cavernous/supraclinoid ICAs without hemodynamically significant stenosis. A1 segments widely patent. Right A1 hypoplastic, accounting for the slightly diminutive right ICA is compared to the left. Normal anterior communicating artery complex. Anterior cerebral arteries widely patent to their distal aspects without stenosis. No M1 stenosis or occlusion. Normal MCA bifurcations. Distal MCA branches well perfused and symmetric. Posterior circulation: Both vertebral arteries widely patent to the vertebrobasilar junction without stenosis. Both picas patent. Basilar widely patent to its distal  aspect without stenosis. Superior cerebral arteries patent bilaterally. Right PCA primarily supplied via the basilar. Left PCA supplied via a hypoplastic left P1 segment and robust left posterior communicating artery. Both PCAs well perfused to their distal aspects without stenosis. Venous sinuses: Patent. Anatomic variants: Hypoplastic right A1 and left P1 segments. No intracranial aneurysm or other vascular abnormality. Review of the MIP images confirms the above findings IMPRESSION: 1. Negative CTA for large vessel occlusion. 2. Moderate atheromatous change about the major arterial vasculature of the head and neck, most pronounced at the aortic arch. Associated short-segment 65% stenosis at the origin of the left subclavian artery. No other hemodynamically significant or correctable stenosis identified. 3.  Emphysema (ICD10-J43.9). Electronically Signed   By: Jeannine Boga M.D.   On: 05/18/2019 21:01   CT Head Wo Contrast  Result Date: 05/18/2019 CLINICAL DATA:  Fall, head trauma EXAM: CT HEAD WITHOUT CONTRAST TECHNIQUE: Contiguous axial images were obtained from the base of the skull through the vertex without intravenous contrast. COMPARISON:  11/13/2016 FINDINGS: Brain: No acute intracranial abnormality. Specifically, no hemorrhage, hydrocephalus, mass lesion, acute infarction, or significant intracranial injury. Vascular: No hyperdense vessel or unexpected calcification. Skull: No acute  calvarial abnormality. Sinuses/Orbits: Visualized paranasal sinuses and mastoids clear. Orbital soft tissues unremarkable. Other: None IMPRESSION: Unremarkable study. Electronically Signed   By: Rolm Baptise M.D.   On: 05/18/2019 18:28   CT Angio Neck W and/or Wo Contrast  Result Date: 05/18/2019 CLINICAL DATA:  Initial evaluation for acute speech difficulty, decreased sensation in left upper extremity. EXAM: CT ANGIOGRAPHY HEAD AND NECK TECHNIQUE: Multidetector CT imaging of the head and neck was performed  using the standard protocol during bolus administration of intravenous contrast. Multiplanar CT image reconstructions and MIPs were obtained to evaluate the vascular anatomy. Carotid stenosis measurements (when applicable) are obtained utilizing NASCET criteria, using the distal internal carotid diameter as the denominator. CONTRAST:  37mL OMNIPAQUE IOHEXOL 350 MG/ML SOLN COMPARISON:  Prior head CT from earlier the same day. FINDINGS: CTA NECK FINDINGS Aortic arch: Visualized aortic arch of normal caliber with normal branch pattern. Moderate to advanced atheromatous change seen about the aortic arch and origin of the great vessels short-segment approximate 65% stenosis noted at the origin of the left subclavian artery (series 8, image 311). Additional extensive atheromatous change throughout the visualized subclavian arteries without high-grade stenosis. No other hemodynamically significant stenosis seen about the origin of the great vessels. Right carotid system: Scattered atheromatous change throughout the right common and internal carotid arteries without hemodynamically significant stenosis. Mild atheromatous change about the right carotid bifurcation without significant stenosis. Left carotid system: Scattered atheromatous change throughout the left CCA without high-grade stenosis. Scattered eccentric calcified plaque about the left bifurcation/proximal left ICA without significant stenosis. Left ICA widely patent distally to the skull base without stenosis, dissection or occlusion. Vertebral arteries: Both vertebral arteries arise from the subclavian arteries. Vertebral arteries widely patent without stenosis, dissection or occlusion. Skeleton: No acute osseous abnormality. No discrete or worrisome osseous lesions. 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 Other neck: No other acute soft tissue abnormality within the neck. No mass lesion or adenopathy. Upper chest: Extensive  paraseptal and centrilobular emphysematous changes noted within the visualized lungs. Associated irregular biapical pleuroparenchymal thickening and/or scarring. Visualized upper chest demonstrates no other acute finding. Review of the MIP images confirms the above findings CTA HEAD FINDINGS Anterior circulation: Petrous segments widely patent bilaterally. Scattered atheromatous plaque within the cavernous/supraclinoid ICAs without hemodynamically significant stenosis. A1 segments widely patent. Right A1 hypoplastic, accounting for the slightly diminutive right ICA is compared to the left. Normal anterior communicating artery complex. Anterior cerebral arteries widely patent to their distal aspects without stenosis. No M1 stenosis or occlusion. Normal MCA bifurcations. Distal MCA branches well perfused and symmetric. Posterior circulation: Both vertebral arteries widely patent to the vertebrobasilar junction without stenosis. Both picas patent. Basilar widely patent to its distal aspect without stenosis. Superior cerebral arteries patent bilaterally. Right PCA primarily supplied via the basilar. Left PCA supplied via a hypoplastic left P1 segment and robust left posterior communicating artery. Both PCAs well perfused to their distal aspects without stenosis. Venous sinuses: Patent. Anatomic variants: Hypoplastic right A1 and left P1 segments. No intracranial aneurysm or other vascular abnormality. Review of the MIP images confirms the above findings IMPRESSION: 1. Negative CTA for large vessel occlusion. 2. Moderate atheromatous change about the major arterial vasculature of the head and neck, most pronounced at the aortic arch. Associated short-segment 65% stenosis at the origin of the left subclavian artery. No other hemodynamically significant or correctable stenosis identified. 3.  Emphysema (ICD10-J43.9). Electronically Signed   By: Jeannine Boga M.D.   On:  05/18/2019 21:01   CT CHEST WO  CONTRAST  Result Date: 05/19/2019 CLINICAL DATA:  Followup abnormal chest x-ray. Shortness of breath. EXAM: CT CHEST WITHOUT CONTRAST TECHNIQUE: Multidetector CT imaging of the chest was performed following the standard protocol without IV contrast. COMPARISON:  Chest x-ray 05/19/2019 and chest CT from 2006 FINDINGS: Cardiovascular: The heart is normal in size for age. No pericardial effusion. Severe/advanced atherosclerotic calcifications involving the thoracic aorta, branch vessels and extensive three-vessel coronary artery calcifications. No aortic aneurysm. Mediastinum/Nodes: Scattered sub 8 mm mediastinal and hilar lymph nodes. No mass or overt adenopathy. Slightly prominent pulmonary arteries suggesting pulmonary hypertension. The esophagus is grossly normal. Lungs/Pleura: Significant biapical pleural and parenchymal scarring changes. Underlying emphysema. Dense areas of linear scarring type changes involving both upper lobes medially extending to the medial pleura in the lower aspect of the chest. No acute infiltrates or pulmonary edema. Very small pleural effusions and overlying atelectasis. Numerous small basilar pulmonary nodules are likely benign. Upper Abdomen: No significant upper abdominal findings. Some streaky residual parenchymal contrast is noted in the kidneys from a contrast enhanced CT of the neck. Recommend correlation with renal function studies as this can be seen with acute tubular necrosis. Advanced vascular calcifications. Musculoskeletal: No significant bony findings. IMPRESSION: 1. Significant biapical pleural and parenchymal scarring changes. 2. Dense areas of linear scarring type changes involving both upper lobes medially extending to the medial pleura in the lower aspect of the chest. 3. Very small pleural effusions and overlying atelectasis and mild dependent edema. 4. Numerous small basilar pulmonary nodules are likely benign. Recommend follow-up noncontrast chest CT in 6 months.  5. Streaky parenchymal residual contrast in the kidneys can be seen with ATN. Recommend correlation with renal function. 6. Advanced atherosclerotic calcifications involving the thoracic aorta and branch vessels including the coronary arteries. 7. Emphysema and aortic atherosclerosis. Aortic Atherosclerosis (ICD10-I70.0) and Emphysema (ICD10-J43.9). Aortic Atherosclerosis (ICD10-I70.0) and Emphysema (ICD10-J43.9). Electronically Signed   By: Marijo Sanes M.D.   On: 05/19/2019 09:38   CT Cervical Spine Wo Contrast  Result Date: 05/18/2019 CLINICAL DATA:  Fall EXAM: CT CERVICAL SPINE WITHOUT CONTRAST TECHNIQUE: Multidetector CT imaging of the cervical spine was performed without intravenous contrast. Multiplanar CT image reconstructions were also generated. COMPARISON:  None. FINDINGS: Alignment: Normal alignment. Skull base and vertebrae: No acute fracture. No primary bone lesion or focal pathologic process. Soft tissues and spinal canal: No prevertebral fluid or swelling. No visible canal hematoma. 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 Degenerative disc and facet disease. Upper chest: Biapical scarring. 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 No acute bony abnormality. Electronically Signed   By: Rolm Baptise M.D.   On: 05/18/2019 18:29   MR Brain Wo Contrast (neuro protocol)  Result Date: 05/18/2019 CLINICAL DATA:  79 year old female status post fall with head trauma, abnormal speech and left arm sensation subsequently. EXAM: MRI HEAD WITHOUT CONTRAST TECHNIQUE: Multiplanar, multiecho pulse sequences of the brain and surrounding structures were obtained without intravenous contrast. COMPARISON:  CT head and CTA head and neck earlier today. FINDINGS: Brain: There is a subtle 4-5 mm focus of restricted diffusion in the right posterior frontal lobe white matter, posterior right corona radiata (series 7,  image 51). No T2 or FLAIR hyperintensity, and no evidence of blood products at this site. No other restricted diffusion. There is a punctate chronic microhemorrhage in the left parietal lobe (series 12, image 30). But elsewhere normal for  age gray and white matter signal throughout the brain. Normal cerebral volume for age. No midline shift, mass effect, evidence of mass lesion, ventriculomegaly, extra-axial collection or acute intracranial hemorrhage. Cervicomedullary junction and pituitary are within normal limits. Vascular: Major intracranial vascular flow voids are preserved. Mild ectasia of the left ICA siphon. Skull and upper cervical spine: Partially visible cervical vertebral ankylosis or congenital incomplete segmentation at C3-C4. Partially visible C4-C5 degenerative spinal stenosis. There is a small nonspecific but circumscribed and probably benign right frontal bone lesion visible on DWI series 5, image 93. Other visible bone marrow signal is within normal limits. Sinuses/Orbits: Postoperative changes to both globes. Paranasal sinuses and mastoids are stable and well pneumatized. Other: Grossly normal visible internal auditory structures. Scalp and face soft tissues appear negative. IMPRESSION: 1. Subtle 4-5 mm focus of restricted diffusion in the posterior right corona radiata appears more likely to be a small acute white matter lacunar infarct than posttraumatic shear injury. No associated hemorrhage or mass effect. 2. Elsewhere negative for age noncontrast MRI appearance of the brain. 3. 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 Electronically Signed   By: Genevie Ann M.D.   On: 05/18/2019 21:27   DG Pelvis Portable  Result Date: 05/19/2019 CLINICAL DATA:  Stroke, fall EXAM: PORTABLE PELVIS 1-2 VIEWS COMPARISON:  None. FINDINGS: Contrast within the urinary bladder and distal ureters. This obscures the sacrum. SI joints grossly non widened. Pubic symphysis  and rami appear intact. Both femoral heads project in joint. No definitive fracture seen. IMPRESSION: Contrast obscures the sacrum. Otherwise no definite acute osseous abnormality is seen Electronically Signed   By: Donavan Foil M.D.   On: 05/19/2019 01:01   DG Chest Portable 1 View  Result Date: 05/19/2019 CLINICAL DATA:  Stroke EXAM: PORTABLE CHEST 1 VIEW COMPARISON:  10/21/2018 FINDINGS: Hyperinflation with emphysematous disease. No consolidation or pleural effusion. Normal heart size with aortic atherosclerosis. No pneumothorax. Asymmetrically dense left hilus with questionable distortion. IMPRESSION: 1. Emphysematous disease without acute airspace disease 2. Slight asymmetrically dense left hilus with questionable distortion. Chest CT follow-up recommended to exclude mass or adenopathy. Electronically Signed   By: Donavan Foil M.D.   On: 05/19/2019 01:03   ECHOCARDIOGRAM COMPLETE  Result Date: 05/19/2019    ECHOCARDIOGRAM REPORT   Patient Name:   Sarah Rojas [facility] Date of Exam: 05/19/2019 Medical Rec #:  [id number]          Height:       61.0 in Accession #:    [id number]         Weight:       98.0 lb Date of Birth:  [birth year]           BSA:          1.395 m Patient Age:    21 years           BP:           96/45 mmHg Patient Gender: F                  HR:           67 bpm. Exam Location:  Inpatient Procedure: 2D Echo, Color Doppler and Cardiac Doppler Indications:    Stroke i163.9  History:        Patient has no prior history of Echocardiogram examinations.                 Risk Factors:Dyslipidemia.  Sonographer:  Raquel Sarna Senior RDCS Referring Phys: [place]  1. Left ventricular ejection fraction, by estimation, is 65 to 70%. The left ventricle has normal function. The left ventricle has no regional wall motion abnormalities. There is mild left ventricular hypertrophy. Left ventricular diastolic parameters are consistent with age-related delayed relaxation (normal).  2.  Right ventricular systolic function is normal. The right ventricular size is normal.  3. The mitral valve is grossly normal. Trivial mitral valve regurgitation.  4. The aortic valve is tricuspid. Aortic valve regurgitation is not visualized.  5. The inferior vena cava is normal in size with greater than 50% respiratory variability, suggesting right atrial pressure of 3 mmHg. FINDINGS  Left Ventricle: Left ventricular ejection fraction, by estimation, is 65 to 70%. The left ventricle has normal function. The left ventricle has no regional wall motion abnormalities. The left ventricular internal cavity size was normal in size. There is  mild left ventricular hypertrophy. Left ventricular diastolic parameters are consistent with age-related delayed relaxation (normal). Right Ventricle: The right ventricular size is normal. No increase in right ventricular wall thickness. Right ventricular systolic function is normal. Left Atrium: Left atrial size was normal in size. Right Atrium: Right atrial size was normal in size. Pericardium: There is no evidence of pericardial effusion. Mitral Valve: The mitral valve is grossly normal. Trivial mitral valve regurgitation. Tricuspid Valve: The tricuspid valve is grossly normal. Tricuspid valve regurgitation is trivial. Aortic Valve: The aortic valve is tricuspid. Aortic valve regurgitation is not visualized. Pulmonic Valve: The pulmonic valve was not well visualized. Pulmonic valve regurgitation is not visualized. Aorta: The aortic root and ascending aorta are structurally normal, with no evidence of dilitation. Venous: The inferior vena cava is normal in size with greater than 50% respiratory variability, suggesting right atrial pressure of 3 mmHg. IAS/Shunts: The interatrial septum was not well visualized.  LEFT VENTRICLE PLAX 2D LVIDd:         3.70 cm  Diastology LVIDs:         1.90 cm  LV e' lateral:   7.29 cm/s LV PW:         1.10 cm  LV E/e' lateral: 14.7 LV IVS:        0.90  cm  LV e' medial:    7.83 cm/s LVOT diam:     2.00 cm  LV E/e' medial:  13.7 LV SV:         76 LV SV Index:   54 LVOT Area:     3.14 cm  RIGHT VENTRICLE RV S prime:     11.00 cm/s TAPSE (M-mode): 2.0 cm LEFT ATRIUM             Index       RIGHT ATRIUM           Index LA diam:        2.80 cm 2.01 cm/m  RA Area:     12.30 cm LA Vol (A2C):   36.9 ml 26.46 ml/m RA Volume:   29.10 ml  20.86 ml/m LA Vol (A4C):   27.0 ml 19.36 ml/m LA Biplane Vol: 33.4 ml 23.95 ml/m  AORTIC VALVE LVOT Vmax:   116.00 cm/s LVOT Vmean:  68.500 cm/s LVOT VTI:    0.241 m  AORTA Ao Root diam: 3.00 cm Ao Asc diam:  3.20 cm MITRAL VALVE MV Area (PHT): 3.31 cm     SHUNTS MV Decel Time: 229 msec     Systemic VTI:  0.24 m MV  E velocity: 107.00 cm/s  Systemic Diam: 2.00 cm MV A velocity: 78.00 cm/s MV E/A ratio:  1.37 Lyman Bishop MD Electronically signed by Lyman Bishop MD Signature Date/Time: 05/19/2019/10:41:01 AM    Final      Labs:   Basic Metabolic Panel: Recent Labs  Lab 05/18/19 1738 05/18/19 1738 05/18/19 1756 05/18/19 1756 05/18/19 2235 05/19/19 0527 05/20/19 0407  NA 140  --  140  --   --  144 140  K 4.0   < > 3.9   < >  --  3.9 5.1  CL 107  --  109  --   --  111 110  CO2 22  --   --   --   --  24 23  GLUCOSE 77  --  69*  --   --  79 83  BUN 20  --  23  --   --  18 12  CREATININE 1.28*  --  1.20*  --  1.22* 1.08* 0.82  CALCIUM 8.8*  --   --   --   --  7.8* 8.0*   < > = values in this interval not displayed.   GFR Estimated Creatinine Clearance: 39.7 mL/min (by C-G formula based on SCr of 0.82 mg/dL). Liver Function Tests: Recent Labs  Lab 05/18/19 1738 05/19/19 0527 05/20/19 0407  AST 27 34 45*  ALT 12 11 10   ALKPHOS 40 35* 32*  BILITOT 0.4 0.4 0.4  PROT 6.1* 5.4* 5.0*  ALBUMIN 3.6 3.1* 3.0*   No results for input(s): LIPASE, AMYLASE in the last 168 hours. No results for input(s): AMMONIA in the last 168 hours. Coagulation profile Recent Labs  Lab 05/18/19 1738  INR 1.1     CBC: Recent Labs  Lab 05/18/19 1738 05/18/19 1756 05/18/19 2235 05/19/19 0527 05/20/19 0407  WBC 7.7  --  6.1 6.3 6.4  NEUTROABS 5.7  --   --  4.5  --   HGB 12.3 12.2 12.5 11.4* 11.5*  HCT 38.3 36.0 39.8 35.7* 37.0  MCV 100.0  --  101.5* 101.1* 102.5*  PLT 163  --  177 165 144*   Cardiac Enzymes: Recent Labs  Lab 05/18/19 1738 05/19/19 0716  CKTOTAL 358* 683*   BNP: Invalid input(s): POCBNP CBG: Recent Labs  Lab 05/20/19 1310 05/20/19 1649 05/20/19 2103 05/21/19 0615 05/21/19 1129  GLUCAP 121* 144* 102* 83 92   D-Dimer No results for input(s): DDIMER in the last 72 hours. Hgb A1c Recent Labs    05/18/19 2236  HGBA1C 5.1   Lipid Profile Recent Labs    05/18/19 2236  CHOL 157  HDL 57  LDLCALC 85  TRIG 76  CHOLHDL 2.8   Thyroid function studies Recent Labs    05/18/19 2240  TSH 2.886   Anemia work up No results for input(s): VITAMINB12, FOLATE, FERRITIN, TIBC, IRON, RETICCTPCT in the last 72 hours. Microbiology Recent Results (from the past 240 hour(s))  SARS Coronavirus 2 by RT PCR (hospital order, performed in [facility] hospital lab) Nasopharyngeal Nasopharyngeal Swab     Status: None   Collection Time: 05/18/19 11:18 PM   Specimen: Nasopharyngeal Swab  Result Value Ref Range Status   SARS Coronavirus 2 NEGATIVE NEGATIVE Final    Comment: (NOTE) SARS-CoV-2 target nucleic acids are NOT DETECTED. The SARS-CoV-2 RNA is generally detectable in upper and lower respiratory specimens during the acute phase of infection. The lowest concentration of SARS-CoV-2 viral copies this assay can detect is 250 copies /  mL. A negative result does not preclude SARS-CoV-2 infection and should not be used as the sole basis for treatment or other patient management decisions.  A negative result may occur with improper specimen collection / handling, submission of specimen other than nasopharyngeal swab, presence of viral mutation(s) within the areas  targeted by this assay, and inadequate number of viral copies (<250 copies / mL). A negative result must be combined with clinical observations, patient history, and epidemiological information. Fact Sheet for Patients:   StrictlyIdeas.no Fact Sheet for Healthcare Providers: BankingDealers.co.za This test is not yet approved or cleared  by the [place] FDA and has been authorized for detection and/or diagnosis of SARS-CoV-2 by FDA under an Emergency Use Authorization (EUA).  This EUA will remain in effect (meaning this test can be used) for the duration of the COVID-19 declaration under Section 564(b)(1) of the Act, 21 U.S.C. section 360bbb-3(b)(1), unless the authorization is terminated or revoked sooner. Performed at [facility], [place] [address], [place], [place] [address]     Please note: You were cared for by a hospitalist during your hospital stay. Once you are discharged, your primary care physician will handle any further medical issues. Please note that NO REFILLS for any discharge medications will be authorized once you are discharged, as it is imperative that you return to your primary care physician (or establish a relationship with a primary care physician if you do not have one) for your post hospital discharge needs so that they can reassess your need for medications and monitor your lab values.  Signed: Terrilee Croak  [facility] 05/21/2019, 12:13 PM

## 2019-05-21 NOTE — Progress Notes (Signed)
Physical Therapy Treatment Patient Details Name: Sarah Rojas MRN: [id number] DOB: [birth year] Today's Date: 05/21/2019    History of Present Illness Sarah Rojas is a 79 y.o. female with history of tobacco abuse and possible COPD had 2 syncopal episodes in 24 hours.  After second one pt's daughter found her on the floor with right sided facial droop along with some slurred speech and left-sided weakness.  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     PT Comments    Pt and dtr were skeptical that pt need oxygen.  Emphasis was to determine supplemental oxygen needs and to work on improving gait stability.  Pt showed drops on sats to 87% on RA during gait.   Follow Up Recommendations  Home health PT;Supervision/Assistance - 24 hour     Equipment Recommendations  None recommended by PT    Recommendations for Other Services       Precautions / Restrictions Precautions Precautions: Fall    Mobility  Bed Mobility Overal bed mobility: Needs Assistance Bed Mobility: Supine to Sit     Supine to sit: Min guard     General bed mobility comments: no assist needed.  Transfers Overall transfer level: Needs assistance   Transfers: Sit to/from Stand Sit to Stand: Min guard            Ambulation/Gait Ambulation/Gait assistance: Min guard;Min assist Gait Distance (Feet): 400 Feet(total) Assistive device: Rolling walker (2 wheeled) Gait Pattern/deviations: Narrow base of support;Scissoring;Step-through pattern Gait velocity: decreased Gait velocity interpretation: <1.8 ft/sec, indicate of risk for recurrent falls General Gait Details: mildly unsteady overall, but safe with RW.  Several episodes of drift and scissoring needing minimal steadying.   Stairs             Wheelchair Mobility    Modified Rankin (Stroke Patients Only) Modified Rankin (Stroke Patients Only) Pre-Morbid Rankin Score: No significant disability Modified Rankin:  Moderate disability     Balance Overall balance assessment: Needs assistance Sitting-balance support: Feet supported;No upper extremity supported Sitting balance-Leahy Scale: Fair     Standing balance support: Single extremity supported;Bilateral upper extremity supported Standing balance-Leahy Scale: Poor Standing balance comment: needs UE support for safety                            Cognition Arousal/Alertness: Awake/alert Behavior During Therapy: Flat affect Overall Cognitive Status: (NT formally)                                 General Comments: pt does not believe that she actually needs oxygen      Exercises      General Comments General comments (skin integrity, edema, etc.): Initially 97% on 3L.  Ambulated the first 100 feet on RA and sats decreased to 88%, 2L added and SpO2 increased to 94% in the (0's bpm.  Again O2 removed and pt amb 200 feet with sats dropping to 87% adn lower 100's bpm  Pt was not symptomatic at 87%      Pertinent Vitals/Pain Pain Assessment: No/denies pain Pain Intervention(s): Monitored during session    Home Living Family/patient expects to be discharged to:: Private residence Living Arrangements: Alone;Children                  Prior Function            PT Goals (current goals  can now be found in the care plan section) Acute Rehab PT Goals Patient Stated Goal: go home PT Goal Formulation: With patient Time For Goal Achievement: 06/02/19 Potential to Achieve Goals: Good Progress towards PT goals: Progressing toward goals    Frequency    Min 4X/week      PT Plan Current plan remains appropriate    Co-evaluation              AM-PAC PT "6 Clicks" Mobility   Outcome Measure  Help needed turning from your back to your side while in a flat bed without using bedrails?: A Little Help needed moving from lying on your back to sitting on the side of a flat bed without using bedrails?: A  Little Help needed moving to and from a bed to a chair (including a wheelchair)?: A Little Help needed standing up from a chair using your arms (e.g., wheelchair or bedside chair)?: A Little Help needed to walk in hospital room?: A Little Help needed climbing 3-5 steps with a railing? : A Lot 6 Click Score: 17    End of Session Equipment Utilized During Treatment: Oxygen Activity Tolerance: Patient tolerated treatment well Patient left: with call bell/phone within reach;in chair;with chair alarm set Nurse Communication: Mobility status PT Visit Diagnosis: Unsteadiness on feet (R26.81);Difficulty in walking, not elsewhere classified (R26.2)     Time: [id number] PT Time Calculation (min) (ACUTE ONLY): 20 min  Charges:  $Gait Training: 8-22 mins                     05/21/2019  Ginger Carne., PT Acute Rehabilitation Services [phone]  (pager) [phone]  (office)   Tessie Fass Haile Bosler 05/21/2019, 4:46 PM

## 2019-05-23 LAB — PROINSULIN/INSULIN RATIO
Insulin: 10 u[IU]/mL
Proinsulin/Insulin Ratio: 31 %
Proinsulin: 21 pmol/L

## 2019-06-29 DIAGNOSIS — I951 Orthostatic hypotension: Secondary | ICD-10-CM | POA: Diagnosis not present

## 2019-06-29 DIAGNOSIS — E162 Hypoglycemia, unspecified: Secondary | ICD-10-CM | POA: Diagnosis not present

## 2019-06-29 DIAGNOSIS — R918 Other nonspecific abnormal finding of lung field: Secondary | ICD-10-CM | POA: Diagnosis not present

## 2019-06-29 DIAGNOSIS — E782 Mixed hyperlipidemia: Secondary | ICD-10-CM | POA: Diagnosis not present

## 2019-06-30 ENCOUNTER — Other Ambulatory Visit: Payer: Self-pay | Admitting: Internal Medicine

## 2019-06-30 DIAGNOSIS — R911 Solitary pulmonary nodule: Secondary | ICD-10-CM

## 2019-08-05 DIAGNOSIS — Z79899 Other long term (current) drug therapy: Secondary | ICD-10-CM | POA: Diagnosis not present

## 2019-08-05 DIAGNOSIS — I951 Orthostatic hypotension: Secondary | ICD-10-CM | POA: Diagnosis not present

## 2019-08-05 DIAGNOSIS — Z0000 Encounter for general adult medical examination without abnormal findings: Secondary | ICD-10-CM | POA: Diagnosis not present

## 2019-08-05 DIAGNOSIS — E782 Mixed hyperlipidemia: Secondary | ICD-10-CM | POA: Diagnosis not present

## 2019-08-11 DIAGNOSIS — E782 Mixed hyperlipidemia: Secondary | ICD-10-CM | POA: Diagnosis not present

## 2019-08-11 DIAGNOSIS — R918 Other nonspecific abnormal finding of lung field: Secondary | ICD-10-CM | POA: Diagnosis not present

## 2019-08-11 DIAGNOSIS — I951 Orthostatic hypotension: Secondary | ICD-10-CM | POA: Diagnosis not present

## 2019-08-11 DIAGNOSIS — M25552 Pain in left hip: Secondary | ICD-10-CM | POA: Diagnosis not present

## 2019-08-11 DIAGNOSIS — Z0000 Encounter for general adult medical examination without abnormal findings: Secondary | ICD-10-CM | POA: Diagnosis not present

## 2019-08-11 DIAGNOSIS — H8110 Benign paroxysmal vertigo, unspecified ear: Secondary | ICD-10-CM | POA: Diagnosis not present

## 2019-08-11 DIAGNOSIS — R69 Illness, unspecified: Secondary | ICD-10-CM | POA: Diagnosis not present

## 2019-08-11 DIAGNOSIS — E162 Hypoglycemia, unspecified: Secondary | ICD-10-CM | POA: Diagnosis not present

## 2019-08-11 DIAGNOSIS — I700 Atherosclerosis of aorta: Secondary | ICD-10-CM | POA: Diagnosis not present

## 2019-10-27 DIAGNOSIS — R69 Illness, unspecified: Secondary | ICD-10-CM | POA: Diagnosis not present

## 2019-11-06 ENCOUNTER — Inpatient Hospital Stay: Admission: RE | Admit: 2019-11-06 | Payer: Medicare HMO | Source: Ambulatory Visit

## 2019-11-24 ENCOUNTER — Ambulatory Visit
Admission: RE | Admit: 2019-11-24 | Discharge: 2019-11-24 | Disposition: A | Discharge status: Home / Self Care | Payer: Medicare HMO | Source: Ambulatory Visit | Attending: Internal Medicine | Admitting: Internal Medicine

## 2019-11-24 ENCOUNTER — Other Ambulatory Visit: Payer: Self-pay

## 2019-11-24 DIAGNOSIS — J984 Other disorders of lung: Secondary | ICD-10-CM | POA: Diagnosis not present

## 2019-11-24 DIAGNOSIS — I700 Atherosclerosis of aorta: Secondary | ICD-10-CM | POA: Diagnosis not present

## 2019-11-24 DIAGNOSIS — R911 Solitary pulmonary nodule: Secondary | ICD-10-CM

## 2019-11-24 DIAGNOSIS — J439 Emphysema, unspecified: Secondary | ICD-10-CM | POA: Diagnosis not present

## 2019-11-24 DIAGNOSIS — I2510 Atherosclerotic heart disease of native coronary artery without angina pectoris: Secondary | ICD-10-CM | POA: Diagnosis not present

## 2019-12-02 DIAGNOSIS — R69 Illness, unspecified: Secondary | ICD-10-CM | POA: Diagnosis not present

## 2019-12-02 DIAGNOSIS — I700 Atherosclerosis of aorta: Secondary | ICD-10-CM | POA: Diagnosis not present

## 2019-12-02 DIAGNOSIS — E782 Mixed hyperlipidemia: Secondary | ICD-10-CM | POA: Diagnosis not present

## 2019-12-03 ENCOUNTER — Other Ambulatory Visit: Payer: Self-pay | Admitting: Internal Medicine

## 2019-12-03 DIAGNOSIS — R911 Solitary pulmonary nodule: Secondary | ICD-10-CM

## 2019-12-09 DIAGNOSIS — E440 Moderate protein-calorie malnutrition: Secondary | ICD-10-CM | POA: Diagnosis not present

## 2019-12-09 DIAGNOSIS — R911 Solitary pulmonary nodule: Secondary | ICD-10-CM | POA: Diagnosis not present

## 2019-12-09 DIAGNOSIS — R69 Illness, unspecified: Secondary | ICD-10-CM | POA: Diagnosis not present

## 2019-12-09 DIAGNOSIS — J439 Emphysema, unspecified: Secondary | ICD-10-CM | POA: Diagnosis not present

## 2019-12-09 DIAGNOSIS — Z79899 Other long term (current) drug therapy: Secondary | ICD-10-CM | POA: Diagnosis not present

## 2019-12-09 DIAGNOSIS — R918 Other nonspecific abnormal finding of lung field: Secondary | ICD-10-CM | POA: Diagnosis not present

## 2019-12-09 DIAGNOSIS — I700 Atherosclerosis of aorta: Secondary | ICD-10-CM | POA: Diagnosis not present

## 2019-12-09 DIAGNOSIS — I951 Orthostatic hypotension: Secondary | ICD-10-CM | POA: Diagnosis not present

## 2019-12-09 DIAGNOSIS — H8110 Benign paroxysmal vertigo, unspecified ear: Secondary | ICD-10-CM | POA: Diagnosis not present

## 2019-12-09 DIAGNOSIS — E782 Mixed hyperlipidemia: Secondary | ICD-10-CM | POA: Diagnosis not present

## 2020-03-09 DIAGNOSIS — J439 Emphysema, unspecified: Secondary | ICD-10-CM | POA: Diagnosis not present

## 2020-03-09 DIAGNOSIS — E782 Mixed hyperlipidemia: Secondary | ICD-10-CM | POA: Diagnosis not present

## 2020-03-09 DIAGNOSIS — Z79899 Other long term (current) drug therapy: Secondary | ICD-10-CM | POA: Diagnosis not present

## 2020-03-17 ENCOUNTER — Other Ambulatory Visit: Payer: Self-pay

## 2020-03-17 ENCOUNTER — Ambulatory Visit
Admission: RE | Admit: 2020-03-17 | Discharge: 2020-03-17 | Disposition: A | Discharge status: Home / Self Care | Payer: Medicare HMO | Source: Ambulatory Visit | Attending: Internal Medicine | Admitting: Internal Medicine

## 2020-03-17 DIAGNOSIS — R918 Other nonspecific abnormal finding of lung field: Secondary | ICD-10-CM | POA: Diagnosis not present

## 2020-03-17 DIAGNOSIS — R911 Solitary pulmonary nodule: Secondary | ICD-10-CM

## 2020-03-28 DIAGNOSIS — E440 Moderate protein-calorie malnutrition: Secondary | ICD-10-CM | POA: Diagnosis not present

## 2020-03-28 DIAGNOSIS — R55 Syncope and collapse: Secondary | ICD-10-CM | POA: Diagnosis not present

## 2020-03-28 DIAGNOSIS — E782 Mixed hyperlipidemia: Secondary | ICD-10-CM | POA: Diagnosis not present

## 2020-03-28 DIAGNOSIS — J432 Centrilobular emphysema: Secondary | ICD-10-CM | POA: Diagnosis not present

## 2020-03-28 DIAGNOSIS — M159 Polyosteoarthritis, unspecified: Secondary | ICD-10-CM | POA: Diagnosis not present

## 2020-03-28 DIAGNOSIS — I951 Orthostatic hypotension: Secondary | ICD-10-CM | POA: Diagnosis not present

## 2020-05-12 ENCOUNTER — Ambulatory Visit: Payer: Self-pay | Admitting: Cardiology

## 2020-08-25 DIAGNOSIS — I951 Orthostatic hypotension: Secondary | ICD-10-CM | POA: Diagnosis not present

## 2020-08-25 DIAGNOSIS — R5383 Other fatigue: Secondary | ICD-10-CM | POA: Diagnosis not present

## 2020-08-25 DIAGNOSIS — E782 Mixed hyperlipidemia: Secondary | ICD-10-CM | POA: Diagnosis not present

## 2020-08-25 DIAGNOSIS — Z0000 Encounter for general adult medical examination without abnormal findings: Secondary | ICD-10-CM | POA: Diagnosis not present

## 2020-09-07 DIAGNOSIS — Z0000 Encounter for general adult medical examination without abnormal findings: Secondary | ICD-10-CM | POA: Diagnosis not present

## 2020-09-07 DIAGNOSIS — R918 Other nonspecific abnormal finding of lung field: Secondary | ICD-10-CM | POA: Diagnosis not present

## 2020-09-07 DIAGNOSIS — I951 Orthostatic hypotension: Secondary | ICD-10-CM | POA: Diagnosis not present

## 2020-09-07 DIAGNOSIS — F411 Generalized anxiety disorder: Secondary | ICD-10-CM | POA: Diagnosis not present

## 2020-09-07 DIAGNOSIS — R7989 Other specified abnormal findings of blood chemistry: Secondary | ICD-10-CM | POA: Diagnosis not present

## 2020-09-07 DIAGNOSIS — I700 Atherosclerosis of aorta: Secondary | ICD-10-CM | POA: Diagnosis not present

## 2020-09-07 DIAGNOSIS — Z79899 Other long term (current) drug therapy: Secondary | ICD-10-CM | POA: Diagnosis not present

## 2020-09-07 DIAGNOSIS — J432 Centrilobular emphysema: Secondary | ICD-10-CM | POA: Diagnosis not present

## 2020-09-07 DIAGNOSIS — E782 Mixed hyperlipidemia: Secondary | ICD-10-CM | POA: Diagnosis not present

## 2021-05-20 ENCOUNTER — Emergency Department (HOSPITAL_BASED_OUTPATIENT_CLINIC_OR_DEPARTMENT_OTHER): Payer: PPO

## 2021-05-20 ENCOUNTER — Encounter (HOSPITAL_BASED_OUTPATIENT_CLINIC_OR_DEPARTMENT_OTHER): Payer: Self-pay

## 2021-05-20 ENCOUNTER — Emergency Department (HOSPITAL_BASED_OUTPATIENT_CLINIC_OR_DEPARTMENT_OTHER): Payer: PPO | Admitting: Radiology

## 2021-05-20 ENCOUNTER — Inpatient Hospital Stay (HOSPITAL_BASED_OUTPATIENT_CLINIC_OR_DEPARTMENT_OTHER)
Admission: EM | Admit: 2021-05-20 | Discharge: 2021-05-25 | DRG: 521 | Disposition: A | Discharge status: Skilled Nursing Facility | Payer: PPO | Attending: Internal Medicine | Admitting: Internal Medicine

## 2021-05-20 ENCOUNTER — Other Ambulatory Visit: Payer: Self-pay

## 2021-05-20 DIAGNOSIS — I9589 Other hypotension: Secondary | ICD-10-CM | POA: Diagnosis present

## 2021-05-20 DIAGNOSIS — I2510 Atherosclerotic heart disease of native coronary artery without angina pectoris: Secondary | ICD-10-CM | POA: Diagnosis present

## 2021-05-20 DIAGNOSIS — Z471 Aftercare following joint replacement surgery: Secondary | ICD-10-CM | POA: Diagnosis not present

## 2021-05-20 DIAGNOSIS — J449 Chronic obstructive pulmonary disease, unspecified: Secondary | ICD-10-CM | POA: Diagnosis present

## 2021-05-20 DIAGNOSIS — Z91040 Latex allergy status: Secondary | ICD-10-CM | POA: Diagnosis not present

## 2021-05-20 DIAGNOSIS — Z4789 Encounter for other orthopedic aftercare: Secondary | ICD-10-CM | POA: Diagnosis not present

## 2021-05-20 DIAGNOSIS — D62 Acute posthemorrhagic anemia: Secondary | ICD-10-CM | POA: Diagnosis not present

## 2021-05-20 DIAGNOSIS — Z720 Tobacco use: Secondary | ICD-10-CM | POA: Diagnosis present

## 2021-05-20 DIAGNOSIS — E7800 Pure hypercholesterolemia, unspecified: Secondary | ICD-10-CM | POA: Diagnosis present

## 2021-05-20 DIAGNOSIS — D631 Anemia in chronic kidney disease: Secondary | ICD-10-CM | POA: Diagnosis present

## 2021-05-20 DIAGNOSIS — Z79899 Other long term (current) drug therapy: Secondary | ICD-10-CM | POA: Diagnosis not present

## 2021-05-20 DIAGNOSIS — Z741 Need for assistance with personal care: Secondary | ICD-10-CM | POA: Diagnosis not present

## 2021-05-20 DIAGNOSIS — S72002A Fracture of unspecified part of neck of left femur, initial encounter for closed fracture: Secondary | ICD-10-CM | POA: Diagnosis present

## 2021-05-20 DIAGNOSIS — M6281 Muscle weakness (generalized): Secondary | ICD-10-CM | POA: Diagnosis not present

## 2021-05-20 DIAGNOSIS — S0990XA Unspecified injury of head, initial encounter: Secondary | ICD-10-CM

## 2021-05-20 DIAGNOSIS — F17210 Nicotine dependence, cigarettes, uncomplicated: Secondary | ICD-10-CM | POA: Diagnosis present

## 2021-05-20 DIAGNOSIS — Z90710 Acquired absence of both cervix and uterus: Secondary | ICD-10-CM

## 2021-05-20 DIAGNOSIS — E785 Hyperlipidemia, unspecified: Secondary | ICD-10-CM | POA: Diagnosis present

## 2021-05-20 DIAGNOSIS — S72012A Unspecified intracapsular fracture of left femur, initial encounter for closed fracture: Secondary | ICD-10-CM | POA: Diagnosis present

## 2021-05-20 DIAGNOSIS — R278 Other lack of coordination: Secondary | ICD-10-CM | POA: Diagnosis not present

## 2021-05-20 DIAGNOSIS — F419 Anxiety disorder, unspecified: Secondary | ICD-10-CM | POA: Diagnosis present

## 2021-05-20 DIAGNOSIS — W010XXA Fall on same level from slipping, tripping and stumbling without subsequent striking against object, initial encounter: Secondary | ICD-10-CM | POA: Diagnosis present

## 2021-05-20 DIAGNOSIS — F0390 Unspecified dementia without behavioral disturbance: Secondary | ICD-10-CM | POA: Diagnosis present

## 2021-05-20 DIAGNOSIS — W19XXXA Unspecified fall, initial encounter: Secondary | ICD-10-CM | POA: Diagnosis not present

## 2021-05-20 DIAGNOSIS — S72002S Fracture of unspecified part of neck of left femur, sequela: Secondary | ICD-10-CM | POA: Diagnosis not present

## 2021-05-20 DIAGNOSIS — R2681 Unsteadiness on feet: Secondary | ICD-10-CM | POA: Diagnosis not present

## 2021-05-20 DIAGNOSIS — E43 Unspecified severe protein-calorie malnutrition: Secondary | ICD-10-CM | POA: Diagnosis present

## 2021-05-20 DIAGNOSIS — K5900 Constipation, unspecified: Secondary | ICD-10-CM | POA: Diagnosis not present

## 2021-05-20 DIAGNOSIS — I700 Atherosclerosis of aorta: Secondary | ICD-10-CM | POA: Diagnosis present

## 2021-05-20 DIAGNOSIS — Z043 Encounter for examination and observation following other accident: Secondary | ICD-10-CM | POA: Diagnosis not present

## 2021-05-20 DIAGNOSIS — N1832 Chronic kidney disease, stage 3b: Secondary | ICD-10-CM | POA: Diagnosis present

## 2021-05-20 DIAGNOSIS — Z8542 Personal history of malignant neoplasm of other parts of uterus: Secondary | ICD-10-CM

## 2021-05-20 DIAGNOSIS — E871 Hypo-osmolality and hyponatremia: Secondary | ICD-10-CM | POA: Diagnosis not present

## 2021-05-20 DIAGNOSIS — M6259 Muscle wasting and atrophy, not elsewhere classified, multiple sites: Secondary | ICD-10-CM | POA: Diagnosis not present

## 2021-05-20 DIAGNOSIS — I959 Hypotension, unspecified: Secondary | ICD-10-CM | POA: Diagnosis not present

## 2021-05-20 DIAGNOSIS — Z743 Need for continuous supervision: Secondary | ICD-10-CM | POA: Diagnosis not present

## 2021-05-20 DIAGNOSIS — Z96642 Presence of left artificial hip joint: Secondary | ICD-10-CM | POA: Diagnosis not present

## 2021-05-20 DIAGNOSIS — R41841 Cognitive communication deficit: Secondary | ICD-10-CM | POA: Diagnosis not present

## 2021-05-20 HISTORY — DX: Chronic obstructive pulmonary disease, unspecified: J44.9

## 2021-05-20 LAB — COMPREHENSIVE METABOLIC PANEL
ALT: 16 U/L (ref 0–44)
AST: 32 U/L (ref 15–41)
Albumin: 3.8 g/dL (ref 3.5–5.0)
Alkaline Phosphatase: 61 U/L (ref 38–126)
Anion gap: 10 (ref 5–15)
BUN: 27 mg/dL — ABNORMAL HIGH (ref 8–23)
CO2: 23 mmol/L (ref 22–32)
Calcium: 8.7 mg/dL — ABNORMAL LOW (ref 8.9–10.3)
Chloride: 103 mmol/L (ref 98–111)
Creatinine, Ser: 1.36 mg/dL — ABNORMAL HIGH (ref 0.44–1.00)
GFR, Estimated: 39 mL/min — ABNORMAL LOW (ref >60)
Glucose, Bld: 84 mg/dL (ref 70–99)
Potassium: 3.6 mmol/L (ref 3.5–5.1)
Sodium: 136 mmol/L (ref 135–145)
Total Bilirubin: 0.3 mg/dL (ref 0.3–1.2)
Total Protein: 6.8 g/dL (ref 6.5–8.1)

## 2021-05-20 LAB — CBC WITH DIFFERENTIAL/PLATELET
Abs Immature Granulocytes: 0.04 10*3/uL (ref 0.00–0.07)
Basophils Absolute: 0.1 10*3/uL (ref 0.0–0.1)
Basophils Relative: 1 %
Eosinophils Absolute: 0.4 10*3/uL (ref 0.0–0.5)
Eosinophils Relative: 4 %
HCT: 38.1 % (ref 36.0–46.0)
Hemoglobin: 12.3 g/dL (ref 12.0–15.0)
Immature Granulocytes: 0 %
Lymphocytes Relative: 13 %
Lymphs Abs: 1.3 10*3/uL (ref 0.7–4.0)
MCH: 30.9 pg (ref 26.0–34.0)
MCHC: 32.3 g/dL (ref 30.0–36.0)
MCV: 95.7 fL (ref 80.0–100.0)
Monocytes Absolute: 0.8 10*3/uL (ref 0.1–1.0)
Monocytes Relative: 9 %
Neutro Abs: 7.1 10*3/uL (ref 1.7–7.7)
Neutrophils Relative %: 73 %
Platelets: 209 10*3/uL (ref 150–400)
RBC: 3.98 MIL/uL (ref 3.87–5.11)
RDW: 14 % (ref 11.5–15.5)
WBC: 9.7 10*3/uL (ref 4.0–10.5)
nRBC: 0 % (ref 0.0–0.2)

## 2021-05-20 MED ORDER — SODIUM CHLORIDE 0.9 % IV SOLN: INTRAVENOUS | Status: DC

## 2021-05-20 MED ORDER — DIAZEPAM 5 MG PO TABS
5.0000 mg | ORAL_TABLET | Freq: Once | ORAL | Status: AC
  Administered 2021-05-20: 5 mg via ORAL
  Filled 2021-05-20: qty 1

## 2021-05-20 NOTE — ED Triage Notes (Signed)
Patient here POV from Home.  States she fell on Sunday when stepping from the [place] into the [place]. States she hit her Posterior Head. No LOC. Endorses Pain to Left Hip.  No Anticoagulants.   NAD Noted during Triage. A&Ox4. GCS 15. BIB Wheelchair.

## 2021-05-20 NOTE — ED Notes (Signed)
Transported to CT 

## 2021-05-20 NOTE — ED Provider Notes (Addendum)
[facility] EMERGENCY DEPT Provider Note   CSN: [id number] Arrival date & time: 05/20/21  1531     History  Chief Complaint  Patient presents with   Lytle Michaels    Sarah Rojas is a 81 y.o. female.  Patient with a fall at home stepping 1 from the porch may have been on Sunday could have been on Wednesday or any time in between.  Family is not certain.  Patient has some memory confusion.  Patient fell hit the back of her head and since that time has not been able to bear weight on her left hip.  Past medical history significant for high cholesterol and abdominal hysterectomy.  Patient may be a smoker.  Patient not on blood thinners.  Patient does use albuterol at home.      Home Medications Prior to Admission medications   Medication Sig Start Date End Date Taking? Authorizing Provider  albuterol (VENTOLIN HFA) 108 (90 Base) MCG/ACT inhaler Inhale 1 puff into the lungs every 4 (four) hours as needed.  01/28/19   [provider]  Aspirin-Salicylamide-Caffeine (BC HEADACHE POWDER PO) Take 1 Package by mouth daily as needed (for headache).     [provider]  diazepam (VALIUM) 5 MG tablet Take 5 mg by mouth 3 (three) times daily as needed. 05/06/19   [provider]  fluticasone (FLONASE) 50 MCG/ACT nasal spray Place 1 spray into both nostrils daily as needed for allergies or rhinitis.  02/05/19   [provider]  Multiple Vitamin (MULTIVITAMIN WITH MINERALS) TABS tablet Take 1 tablet by mouth daily.    [provider]  rosuvastatin (CRESTOR) 5 MG tablet Take 5 mg by mouth daily. 03/13/19   [provider]      Allergies    Latex    Review of Systems   Review of Systems  Unable to perform ROS: Dementia  Skin:  Positive for wound.  Psychiatric/Behavioral:  Positive for confusion.   All other systems reviewed and are negative.  Physical Exam Updated Vital Signs BP (!) 130/57   Pulse 86   Temp 97.7 F (36.5 C)  (Temporal)   Resp 20   Ht 1.549 m ('5\' 1"'$ )   Wt 44.5 kg   SpO2 100%   BMI 18.54 kg/m  Physical Exam Vitals and nursing note reviewed.  Constitutional:      General: She is not in acute distress.    Appearance: Normal appearance. She is well-developed.  HENT:     Head: Normocephalic.     Comments: Tenderness to palpation to the posterior part of the head no significant hematoma no laceration.    Mouth/Throat:     Mouth: Mucous membranes are moist.  Eyes:     Extraocular Movements: Extraocular movements intact.     Conjunctiva/sclera: Conjunctivae normal.     Pupils: Pupils are equal, round, and reactive to light.  Cardiovascular:     Rate and Rhythm: Normal rate and regular rhythm.     Heart sounds: No murmur heard. Pulmonary:     Effort: Pulmonary effort is normal. No respiratory distress.     Breath sounds: Normal breath sounds.  Abdominal:     Palpations: Abdomen is soft.     Tenderness: There is no abdominal tenderness.  Musculoskeletal:        General: No swelling, tenderness, deformity or signs of injury.     Cervical back: Normal range of motion and neck supple. No rigidity or tenderness.     Comments: Patient  in wheelchair.  She cannot tell if there is left leg shortening.  Patient has good cap refill.  Sensation intact on top and bottom of the foot.  There is some pain with range of motion of the hip area.  Right lower extremity without any abnormalities.  Upper extremities without any abnormalities.  Skin:    General: Skin is warm and dry.     Capillary Refill: Capillary refill takes less than 2 seconds.  Neurological:     General: No focal deficit present.     Mental Status: She is alert.     Cranial Nerves: No cranial nerve deficit.     Sensory: No sensory deficit.     Motor: No weakness.  Psychiatric:        Mood and Affect: Mood normal.    ED Results / Procedures / Treatments   Labs (all labs ordered are listed, but only abnormal results are  displayed) Labs Reviewed  CBC WITH DIFFERENTIAL/PLATELET  COMPREHENSIVE METABOLIC PANEL  URINALYSIS, ROUTINE W REFLEX MICROSCOPIC    EKG None  Radiology CT Head Wo Contrast  Result Date: 05/20/2021 CLINICAL DATA:  Head trauma, moderate-severe EXAM: CT HEAD WITHOUT CONTRAST TECHNIQUE: Contiguous axial images were obtained from the base of the skull through the vertex without intravenous contrast. RADIATION DOSE REDUCTION: This exam was performed according to the departmental dose-optimization program which includes automated exposure control, adjustment of the mA and/or kV according to patient size and/or use of iterative reconstruction technique. COMPARISON:  05/18/2019 FINDINGS: Brain: No evidence of acute infarction, hemorrhage, hydrocephalus, extra-axial collection or mass lesion/mass effect. Scattered low-density changes within the periventricular and subcortical white matter compatible with chronic microvascular ischemic change. Mild diffuse cerebral volume loss. Vascular: Atherosclerotic calcifications involving the large vessels of the skull base. No unexpected hyperdense vessel. Skull: Normal. Negative for fracture or focal lesion. Sinuses/Orbits: No acute finding. Other: None. IMPRESSION: 1. No acute intracranial abnormality. 2. Chronic microvascular ischemic change and cerebral volume loss. Electronically Signed   By: Davina Poke D.O.   On: 05/20/2021 18:53   DG Chest Port 1 View  Result Date: 05/20/2021 CLINICAL DATA:  Fall. EXAM: PORTABLE CHEST 1 VIEW COMPARISON:  Chest x-ray 05/19/2019.  Chest CT 03/17/2020. FINDINGS: Linear scarring is again seen bilaterally. There is a questionable small focal opacity in the right upper lobe which was not seen on the prior study. This is slightly nodular. There is no lung consolidation, pleural effusion or pneumothorax. Cardiomediastinal silhouette is within normal limits. No fractures are seen. IMPRESSION: 1. Questionable small/nodular density  in the right upper lobe. Recommend follow-up nonemergent chest CT. 2. Stable areas of bilateral scarring. Electronically Signed   By: Ronney Asters M.D.   On: 05/20/2021 18:53   DG Hip Unilat With Pelvis 2-3 Views Left  Result Date: 05/20/2021 CLINICAL DATA:  Fall.  Pain in the left hip. EXAM: DG HIP (WITH OR WITHOUT PELVIS) 2-3V LEFT COMPARISON:  None Available. FINDINGS: There is an acute fracture through the subcapital left femoral neck region. There is superolateral displacement of the distal fracture fragment. There is no dislocation. Joint spaces are well maintained. Peripheral vascular calcifications are present. IMPRESSION: 1. Displaced left femoral neck fracture. Electronically Signed   By: Ronney Asters M.D.   On: 05/20/2021 18:27    Procedures Procedures    Medications Ordered in ED Medications  0.9 %  sodium chloride infusion ( Intravenous New Bag/Given 05/20/21 1857)    ED Course/ Medical Decision Making/ A&P  Medical Decision Making Amount and/or Complexity of Data Reviewed Labs: ordered. Radiology: ordered.  Risk Prescription drug management. Decision regarding hospitalization.    CRITICAL CARE Performed by: Fredia Sorrow Total critical care time: 35 minutes Critical care time was exclusive of separately billable procedures and treating other patients. Critical care was necessary to treat or prevent imminent or life-threatening deterioration. Critical care was time spent personally by me on the following activities: development of treatment plan with patient and/or surrogate as well as nursing, discussions with consultants, evaluation of patient's response to treatment, examination of patient, obtaining history from patient or surrogate, ordering and performing treatments and interventions, ordering and review of laboratory studies, ordering and review of radiographic studies, pulse oximetry and re-evaluation of patient's condition.  X-ray  of the left hip shows a femoral neck fracture.  Based on this CT head was ordered.  And a chest x-ray in preparation for surgery.  We will discuss with Dr. Marlou Sa on-call for Ortho care.  CBC no leukocytosis hemoglobin 10.2 likely metabolic panel still pending.  Chest x-ray raises questionable small nodule density in the right upper lobe.  This can be followed up by the hospitalist.  I do recommend nonemergent chest CT.  CT head since she hit the back of her head when she fell no evidence of any acute findings.  X-ray of the hip shows a displaced left femoral neck fracture.  Dr. Marlou Sa requesting admission to [facility] I plans on fixing her tomorrow.  He will be working out of home tomorrow.  He would like her to be n.p.o. after midnight.  Will discuss with hospitalist for admission.     Final Clinical Impression(s) / ED Diagnoses Final diagnoses:  Fall, initial encounter  Injury of head, initial encounter  Closed fracture of left hip, initial encounter [facility])    Rx / DC Orders ED Discharge Orders     None         Fredia Sorrow, MD 05/20/21 Einar Crow    Fredia Sorrow, MD 05/20/21 1929

## 2021-05-20 NOTE — ED Notes (Signed)
Pt and daughter stepping outside for a few minutes. MD made aware.

## 2021-05-20 NOTE — ED Notes (Signed)
Pt presents to room in wheelchair and requests to stay in wheelchair until after XR. Pt daughter is at bedside.  PMS intact distal to injury/pain.

## 2021-05-20 NOTE — ED Notes (Signed)
Pt changed into gown and now in stretcher, daughter in law still at the bedside. Pt updated on plan of care.

## 2021-05-21 ENCOUNTER — Inpatient Hospital Stay (HOSPITAL_COMMUNITY): Payer: PPO

## 2021-05-21 ENCOUNTER — Encounter (HOSPITAL_COMMUNITY): Payer: Self-pay | Admitting: Internal Medicine

## 2021-05-21 ENCOUNTER — Inpatient Hospital Stay (HOSPITAL_COMMUNITY): Payer: PPO | Admitting: Certified Registered"

## 2021-05-21 ENCOUNTER — Encounter (HOSPITAL_COMMUNITY)
Admission: EM | Disposition: A | Discharge status: Skilled Nursing Facility | Payer: Self-pay | Source: Home / Self Care | Attending: Internal Medicine

## 2021-05-21 ENCOUNTER — Other Ambulatory Visit: Payer: Self-pay

## 2021-05-21 DIAGNOSIS — N1832 Chronic kidney disease, stage 3b: Secondary | ICD-10-CM | POA: Diagnosis present

## 2021-05-21 DIAGNOSIS — I9589 Other hypotension: Secondary | ICD-10-CM | POA: Diagnosis present

## 2021-05-21 DIAGNOSIS — E43 Unspecified severe protein-calorie malnutrition: Secondary | ICD-10-CM | POA: Diagnosis present

## 2021-05-21 DIAGNOSIS — D631 Anemia in chronic kidney disease: Secondary | ICD-10-CM | POA: Diagnosis present

## 2021-05-21 DIAGNOSIS — Z90710 Acquired absence of both cervix and uterus: Secondary | ICD-10-CM | POA: Diagnosis not present

## 2021-05-21 DIAGNOSIS — F419 Anxiety disorder, unspecified: Secondary | ICD-10-CM | POA: Diagnosis present

## 2021-05-21 DIAGNOSIS — E7800 Pure hypercholesterolemia, unspecified: Secondary | ICD-10-CM | POA: Diagnosis present

## 2021-05-21 DIAGNOSIS — Z8542 Personal history of malignant neoplasm of other parts of uterus: Secondary | ICD-10-CM | POA: Diagnosis not present

## 2021-05-21 DIAGNOSIS — K5900 Constipation, unspecified: Secondary | ICD-10-CM | POA: Diagnosis not present

## 2021-05-21 DIAGNOSIS — Z96642 Presence of left artificial hip joint: Secondary | ICD-10-CM | POA: Diagnosis not present

## 2021-05-21 DIAGNOSIS — F0390 Unspecified dementia without behavioral disturbance: Secondary | ICD-10-CM | POA: Diagnosis present

## 2021-05-21 DIAGNOSIS — S72012A Unspecified intracapsular fracture of left femur, initial encounter for closed fracture: Secondary | ICD-10-CM | POA: Diagnosis present

## 2021-05-21 DIAGNOSIS — J449 Chronic obstructive pulmonary disease, unspecified: Secondary | ICD-10-CM | POA: Diagnosis present

## 2021-05-21 DIAGNOSIS — Z471 Aftercare following joint replacement surgery: Secondary | ICD-10-CM | POA: Diagnosis not present

## 2021-05-21 DIAGNOSIS — D62 Acute posthemorrhagic anemia: Secondary | ICD-10-CM | POA: Diagnosis not present

## 2021-05-21 DIAGNOSIS — S72002S Fracture of unspecified part of neck of left femur, sequela: Secondary | ICD-10-CM | POA: Diagnosis not present

## 2021-05-21 DIAGNOSIS — Z91040 Latex allergy status: Secondary | ICD-10-CM | POA: Diagnosis not present

## 2021-05-21 DIAGNOSIS — Z720 Tobacco use: Secondary | ICD-10-CM | POA: Diagnosis not present

## 2021-05-21 DIAGNOSIS — S72002A Fracture of unspecified part of neck of left femur, initial encounter for closed fracture: Secondary | ICD-10-CM | POA: Diagnosis present

## 2021-05-21 DIAGNOSIS — W010XXA Fall on same level from slipping, tripping and stumbling without subsequent striking against object, initial encounter: Secondary | ICD-10-CM | POA: Diagnosis present

## 2021-05-21 DIAGNOSIS — E785 Hyperlipidemia, unspecified: Secondary | ICD-10-CM | POA: Diagnosis present

## 2021-05-21 DIAGNOSIS — Z79899 Other long term (current) drug therapy: Secondary | ICD-10-CM | POA: Diagnosis not present

## 2021-05-21 DIAGNOSIS — E871 Hypo-osmolality and hyponatremia: Secondary | ICD-10-CM | POA: Diagnosis not present

## 2021-05-21 DIAGNOSIS — F17210 Nicotine dependence, cigarettes, uncomplicated: Secondary | ICD-10-CM | POA: Diagnosis present

## 2021-05-21 DIAGNOSIS — IMO0001 Reserved for inherently not codable concepts without codable children: Secondary | ICD-10-CM | POA: Diagnosis present

## 2021-05-21 DIAGNOSIS — I700 Atherosclerosis of aorta: Secondary | ICD-10-CM | POA: Diagnosis present

## 2021-05-21 DIAGNOSIS — I2510 Atherosclerotic heart disease of native coronary artery without angina pectoris: Secondary | ICD-10-CM | POA: Diagnosis present

## 2021-05-21 HISTORY — PX: TOTAL HIP ARTHROPLASTY: SHX124

## 2021-05-21 LAB — PROTIME-INR
INR: 1 (ref 0.8–1.2)
Prothrombin Time: 13.4 seconds (ref 11.4–15.2)

## 2021-05-21 LAB — SURGICAL PCR SCREEN
MRSA, PCR: NEGATIVE
Staphylococcus aureus: POSITIVE — AB

## 2021-05-21 SURGERY — ARTHROPLASTY, HIP, TOTAL, ANTERIOR APPROACH
Anesthesia: Monitor Anesthesia Care | Site: Hip | Laterality: Left

## 2021-05-21 MED ORDER — ONDANSETRON HCL 4 MG/2ML IJ SOLN: 4.0000 mg | Freq: Four times a day (QID) | INTRAMUSCULAR | Status: DC | PRN

## 2021-05-21 MED ORDER — PROPOFOL 500 MG/50ML IV EMUL
INTRAVENOUS | Status: DC | PRN
  Administered 2021-05-21: 10 ug/kg/min via INTRAVENOUS

## 2021-05-21 MED ORDER — ASPIRIN 81 MG PO CHEW
81.0000 mg | CHEWABLE_TABLET | Freq: Two times a day (BID) | ORAL | Status: DC
  Administered 2021-05-21 – 2021-05-25 (×8): 81 mg via ORAL
  Filled 2021-05-21 (×8): qty 1

## 2021-05-21 MED ORDER — PROPOFOL 10 MG/ML IV BOLUS
INTRAVENOUS | Status: AC
  Filled 2021-05-21: qty 20

## 2021-05-21 MED ORDER — MORPHINE SULFATE (PF) 2 MG/ML IV SOLN: 0.5000 mg | INTRAVENOUS | Status: DC | PRN

## 2021-05-21 MED ORDER — CEFAZOLIN SODIUM-DEXTROSE 2-4 GM/100ML-% IV SOLN
2.0000 g | Freq: Three times a day (TID) | INTRAVENOUS | Status: AC
  Administered 2021-05-21: 2 g via INTRAVENOUS
  Filled 2021-05-21: qty 100

## 2021-05-21 MED ORDER — ORAL CARE MOUTH RINSE: 15.0000 mL | Freq: Once | OROMUCOSAL | Status: AC

## 2021-05-21 MED ORDER — DEXAMETHASONE SODIUM PHOSPHATE 10 MG/ML IJ SOLN
INTRAMUSCULAR | Status: AC
  Filled 2021-05-21: qty 1

## 2021-05-21 MED ORDER — ONDANSETRON HCL 4 MG PO TABS: 4.0000 mg | ORAL_TABLET | Freq: Four times a day (QID) | ORAL | Status: DC | PRN

## 2021-05-21 MED ORDER — LACTATED RINGERS IV SOLN: INTRAVENOUS | Status: DC

## 2021-05-21 MED ORDER — SODIUM CHLORIDE 0.9 % IV SOLN
INTRAVENOUS | Status: DC | PRN
Start: 2021-05-21 — End: 2021-05-21

## 2021-05-21 MED ORDER — OXYCODONE HCL 5 MG PO TABS: 5.0000 mg | ORAL_TABLET | ORAL | Status: DC | PRN

## 2021-05-21 MED ORDER — BUPIVACAINE IN DEXTROSE 0.75-8.25 % IT SOLN
INTRATHECAL | Status: DC | PRN
  Administered 2021-05-21: 2 mL via INTRATHECAL

## 2021-05-21 MED ORDER — MORPHINE SULFATE (PF) 4 MG/ML IV SOLN
INTRAVENOUS | Status: DC | PRN
  Administered 2021-05-21: 8 mg

## 2021-05-21 MED ORDER — NALOXONE HCL 0.4 MG/ML IJ SOLN
0.4000 mg | INTRAMUSCULAR | Status: DC | PRN
Start: 2021-05-21 — End: 2021-05-25

## 2021-05-21 MED ORDER — METHOCARBAMOL 500 MG PO TABS
500.0000 mg | ORAL_TABLET | Freq: Four times a day (QID) | ORAL | Status: DC | PRN
  Filled 2021-05-21: qty 1

## 2021-05-21 MED ORDER — FENTANYL CITRATE (PF) 250 MCG/5ML IJ SOLN
INTRAMUSCULAR | Status: DC | PRN
  Administered 2021-05-21: 50 ug via INTRAVENOUS

## 2021-05-21 MED ORDER — HYDROCODONE-ACETAMINOPHEN 5-325 MG PO TABS
ORAL_TABLET | ORAL | Status: AC
  Filled 2021-05-21: qty 2

## 2021-05-21 MED ORDER — METOCLOPRAMIDE HCL 5 MG PO TABS: 5.0000 mg | ORAL_TABLET | Freq: Three times a day (TID) | ORAL | Status: DC | PRN

## 2021-05-21 MED ORDER — ALBUTEROL SULFATE (2.5 MG/3ML) 0.083% IN NEBU: 2.5000 mg | INHALATION_SOLUTION | RESPIRATORY_TRACT | Status: DC | PRN

## 2021-05-21 MED ORDER — MORPHINE SULFATE (PF) 4 MG/ML IV SOLN
INTRAVENOUS | Status: AC
  Filled 2021-05-21: qty 2

## 2021-05-21 MED ORDER — FENTANYL CITRATE PF 50 MCG/ML IJ SOSY
50.0000 ug | PREFILLED_SYRINGE | Freq: Once | INTRAMUSCULAR | Status: AC
  Administered 2021-05-21: 50 ug via INTRAVENOUS
  Filled 2021-05-21: qty 1

## 2021-05-21 MED ORDER — ROSUVASTATIN CALCIUM 5 MG PO TABS
5.0000 mg | ORAL_TABLET | Freq: Every day | ORAL | Status: DC
  Administered 2021-05-21 – 2021-05-24 (×4): 5 mg via ORAL
  Filled 2021-05-21 (×4): qty 1

## 2021-05-21 MED ORDER — DOCUSATE SODIUM 100 MG PO CAPS
100.0000 mg | ORAL_CAPSULE | Freq: Two times a day (BID) | ORAL | Status: DC
  Administered 2021-05-21 – 2021-05-23 (×4): 100 mg via ORAL
  Filled 2021-05-21 (×4): qty 1

## 2021-05-21 MED ORDER — ALBUTEROL SULFATE HFA 108 (90 BASE) MCG/ACT IN AERS: 1.0000 | INHALATION_SPRAY | RESPIRATORY_TRACT | Status: DC | PRN

## 2021-05-21 MED ORDER — DEXAMETHASONE SODIUM PHOSPHATE 10 MG/ML IJ SOLN
INTRAMUSCULAR | Status: DC | PRN
  Administered 2021-05-21: 10 mg via INTRAVENOUS

## 2021-05-21 MED ORDER — PHENOL 1.4 % MT LIQD: 1.0000 | OROMUCOSAL | Status: DC | PRN

## 2021-05-21 MED ORDER — BUPIVACAINE HCL (PF) 0.25 % IJ SOLN
INTRAMUSCULAR | Status: AC
  Filled 2021-05-21: qty 30

## 2021-05-21 MED ORDER — METHOCARBAMOL 1000 MG/10ML IJ SOLN
500.0000 mg | Freq: Four times a day (QID) | INTRAVENOUS | Status: DC | PRN
  Filled 2021-05-21: qty 5

## 2021-05-21 MED ORDER — METOCLOPRAMIDE HCL 5 MG/ML IJ SOLN: 5.0000 mg | Freq: Three times a day (TID) | INTRAMUSCULAR | Status: DC | PRN

## 2021-05-21 MED ORDER — BUPIVACAINE HCL (PF) 0.25 % IJ SOLN
INTRAMUSCULAR | Status: AC
  Filled 2021-05-21: qty 10

## 2021-05-21 MED ORDER — SODIUM CHLORIDE 0.9 % IR SOLN
Status: DC | PRN
  Administered 2021-05-21: 3000 mL

## 2021-05-21 MED ORDER — LIDOCAINE HCL (CARDIAC) PF 100 MG/5ML IV SOSY
PREFILLED_SYRINGE | INTRAVENOUS | Status: DC | PRN
  Administered 2021-05-21: 50 mg via INTRAVENOUS

## 2021-05-21 MED ORDER — ONDANSETRON HCL 4 MG/2ML IJ SOLN
INTRAMUSCULAR | Status: DC | PRN
  Administered 2021-05-21: 4 mg via INTRAVENOUS

## 2021-05-21 MED ORDER — FENTANYL CITRATE (PF) 250 MCG/5ML IJ SOLN
INTRAMUSCULAR | Status: AC
  Filled 2021-05-21: qty 5

## 2021-05-21 MED ORDER — MENTHOL 3 MG MT LOZG: 1.0000 | LOZENGE | OROMUCOSAL | Status: DC | PRN

## 2021-05-21 MED ORDER — DEXTROSE 5 % IV SOLN
500.0000 mg | Freq: Four times a day (QID) | INTRAVENOUS | Status: DC | PRN
  Filled 2021-05-21: qty 5

## 2021-05-21 MED ORDER — CHLORHEXIDINE GLUCONATE 0.12 % MT SOLN
15.0000 mL | Freq: Once | OROMUCOSAL | Status: AC
  Administered 2021-05-21: 15 mL via OROMUCOSAL
  Filled 2021-05-21: qty 15

## 2021-05-21 MED ORDER — PROPOFOL 10 MG/ML IV BOLUS
INTRAVENOUS | Status: DC | PRN
  Administered 2021-05-21: 10 mg via INTRAVENOUS

## 2021-05-21 MED ORDER — CLONIDINE HCL (ANALGESIA) 100 MCG/ML EP SOLN
EPIDURAL | Status: AC
  Filled 2021-05-21: qty 10

## 2021-05-21 MED ORDER — 0.9 % SODIUM CHLORIDE (POUR BTL) OPTIME
TOPICAL | Status: DC | PRN
  Administered 2021-05-21: 1000 mL

## 2021-05-21 MED ORDER — DIAZEPAM 5 MG PO TABS
5.0000 mg | ORAL_TABLET | Freq: Three times a day (TID) | ORAL | Status: DC | PRN
  Administered 2021-05-21 – 2021-05-25 (×6): 5 mg via ORAL
  Filled 2021-05-21 (×5): qty 1

## 2021-05-21 MED ORDER — DIAZEPAM 5 MG PO TABS
ORAL_TABLET | ORAL | Status: AC
  Filled 2021-05-21: qty 1

## 2021-05-21 MED ORDER — FENTANYL CITRATE (PF) 100 MCG/2ML IJ SOLN
INTRAMUSCULAR | Status: AC
  Filled 2021-05-21: qty 2

## 2021-05-21 MED ORDER — LACTATED RINGERS IV SOLN: INTRAVENOUS | Status: DC | PRN

## 2021-05-21 MED ORDER — FENTANYL CITRATE PF 50 MCG/ML IJ SOSY: 25.0000 ug | PREFILLED_SYRINGE | INTRAMUSCULAR | Status: DC | PRN

## 2021-05-21 MED ORDER — ACETAMINOPHEN 325 MG PO TABS: 650.0000 mg | ORAL_TABLET | Freq: Four times a day (QID) | ORAL | Status: DC | PRN

## 2021-05-21 MED ORDER — EPHEDRINE SULFATE (PRESSORS) 50 MG/ML IJ SOLN
INTRAMUSCULAR | Status: DC | PRN
  Administered 2021-05-21: 5 mg via INTRAVENOUS
  Administered 2021-05-21: 10 mg via INTRAVENOUS
  Administered 2021-05-21: 5 mg via INTRAVENOUS

## 2021-05-21 MED ORDER — POLYETHYLENE GLYCOL 3350 17 G PO PACK: 17.0000 g | PACK | Freq: Every day | ORAL | Status: DC | PRN

## 2021-05-21 MED ORDER — BISACODYL 5 MG PO TBEC
5.0000 mg | DELAYED_RELEASE_TABLET | Freq: Every day | ORAL | Status: DC | PRN
Start: 2021-05-21 — End: 2021-05-25

## 2021-05-21 MED ORDER — TRANEXAMIC ACID-NACL 1000-0.7 MG/100ML-% IV SOLN
1000.0000 mg | Freq: Once | INTRAVENOUS | Status: AC
  Administered 2021-05-21: 1000 mg via INTRAVENOUS
  Filled 2021-05-21: qty 100

## 2021-05-21 MED ORDER — VANCOMYCIN HCL 1000 MG IV SOLR
INTRAVENOUS | Status: AC
  Filled 2021-05-21: qty 20

## 2021-05-21 MED ORDER — BUPIVACAINE HCL (PF) 0.25 % IJ SOLN
INTRAMUSCULAR | Status: DC | PRN
  Administered 2021-05-21: 30 mL

## 2021-05-21 MED ORDER — MIDODRINE HCL 5 MG PO TABS
5.0000 mg | ORAL_TABLET | Freq: Three times a day (TID) | ORAL | Status: DC
  Administered 2021-05-21 – 2021-05-25 (×10): 5 mg via ORAL
  Filled 2021-05-21 (×10): qty 1

## 2021-05-21 MED ORDER — LIDOCAINE 2% (20 MG/ML) 5 ML SYRINGE
INTRAMUSCULAR | Status: AC
  Filled 2021-05-21: qty 5

## 2021-05-21 MED ORDER — METHOCARBAMOL 500 MG PO TABS
500.0000 mg | ORAL_TABLET | Freq: Four times a day (QID) | ORAL | Status: DC | PRN
  Administered 2021-05-23 (×2): 500 mg via ORAL
  Filled 2021-05-21: qty 1

## 2021-05-21 MED ORDER — CEFAZOLIN SODIUM-DEXTROSE 2-4 GM/100ML-% IV SOLN
2.0000 g | INTRAVENOUS | Status: AC
  Administered 2021-05-21: 2 g via INTRAVENOUS
  Filled 2021-05-21: qty 100

## 2021-05-21 MED ORDER — MORPHINE SULFATE (PF) 2 MG/ML IV SOLN: 2.0000 mg | INTRAVENOUS | Status: DC | PRN

## 2021-05-21 MED ORDER — ACETAMINOPHEN 325 MG PO TABS: 325.0000 mg | ORAL_TABLET | Freq: Four times a day (QID) | ORAL | Status: DC | PRN

## 2021-05-21 MED ORDER — EPHEDRINE 5 MG/ML INJ
INTRAVENOUS | Status: AC
  Filled 2021-05-21: qty 5

## 2021-05-21 MED ORDER — FENTANYL CITRATE (PF) 100 MCG/2ML IJ SOLN
25.0000 ug | INTRAMUSCULAR | Status: DC | PRN
  Administered 2021-05-21: 25 ug via INTRAVENOUS

## 2021-05-21 MED ORDER — ACETAMINOPHEN 500 MG PO TABS
500.0000 mg | ORAL_TABLET | Freq: Four times a day (QID) | ORAL | Status: AC
  Administered 2021-05-21 – 2021-05-22 (×3): 500 mg via ORAL
  Filled 2021-05-21 (×3): qty 1

## 2021-05-21 MED ORDER — CHLORHEXIDINE GLUCONATE 4 % EX LIQD
60.0000 mL | Freq: Once | CUTANEOUS | Status: DC
  Filled 2021-05-21: qty 60

## 2021-05-21 MED ORDER — TRANEXAMIC ACID-NACL 1000-0.7 MG/100ML-% IV SOLN
1000.0000 mg | INTRAVENOUS | Status: AC
  Administered 2021-05-21: 1000 mg via INTRAVENOUS
  Filled 2021-05-21: qty 100

## 2021-05-21 MED ORDER — ACETAMINOPHEN 650 MG RE SUPP: 650.0000 mg | Freq: Four times a day (QID) | RECTAL | Status: DC | PRN

## 2021-05-21 MED ORDER — CLONIDINE HCL (ANALGESIA) 100 MCG/ML EP SOLN
EPIDURAL | Status: DC | PRN
Start: 2021-05-21 — End: 2021-05-21
  Administered 2021-05-21: 50 ug via INTRA_ARTICULAR

## 2021-05-21 MED ORDER — HYDROCODONE-ACETAMINOPHEN 5-325 MG PO TABS
1.0000 | ORAL_TABLET | ORAL | Status: DC | PRN
  Administered 2021-05-21: 2 via ORAL
  Administered 2021-05-22 – 2021-05-23 (×4): 1 via ORAL
  Administered 2021-05-24 – 2021-05-25 (×5): 2 via ORAL
  Filled 2021-05-21: qty 2
  Filled 2021-05-21 (×3): qty 1
  Filled 2021-05-21 (×4): qty 2
  Filled 2021-05-21: qty 1

## 2021-05-21 MED ORDER — VANCOMYCIN HCL 1000 MG IV SOLR
INTRAVENOUS | Status: DC | PRN
  Administered 2021-05-21: 1000 mg via TOPICAL

## 2021-05-21 MED ORDER — NICOTINE 14 MG/24HR TD PT24
14.0000 mg | MEDICATED_PATCH | Freq: Every day | TRANSDERMAL | Status: DC | PRN
Start: 2021-05-21 — End: 2021-05-25

## 2021-05-21 MED ORDER — ONDANSETRON HCL 4 MG/2ML IJ SOLN
INTRAMUSCULAR | Status: AC
  Filled 2021-05-21: qty 2

## 2021-05-21 MED ORDER — IRRISEPT - 450ML BOTTLE WITH 0.05% CHG IN STERILE WATER, USP 99.95% OPTIME
TOPICAL | Status: DC | PRN
Start: 2021-05-21 — End: 2021-05-21
  Administered 2021-05-21: 450 mL

## 2021-05-21 MED ORDER — DOCUSATE SODIUM 100 MG PO CAPS
100.0000 mg | ORAL_CAPSULE | Freq: Two times a day (BID) | ORAL | Status: DC
  Filled 2021-05-21 (×2): qty 1

## 2021-05-21 MED ORDER — POVIDONE-IODINE 10 % EX SWAB
2.0000 "application " | Freq: Once | CUTANEOUS | Status: AC
  Administered 2021-05-21: 2 via TOPICAL

## 2021-05-21 SURGICAL SUPPLY — 69 items
BAG COUNTER SPONGE SURGICOUNT (BAG) ×3 IMPLANT
BAG SPNG CNTER NS LX DISP (BAG) ×1
BLADE SAW SGTL 18X1.27X75 (BLADE) ×1 IMPLANT
CELLS DAT CNTRL 66122 CELL SVR (MISCELLANEOUS) ×1 IMPLANT
COVER PERINEAL POST (MISCELLANEOUS) ×1 IMPLANT
COVER SURGICAL LIGHT HANDLE (MISCELLANEOUS) ×2 IMPLANT
CUP ACET PINNACLE SECTR 48MM (Joint) IMPLANT
DRAPE C-ARM 42X72 X-RAY (DRAPES) ×1 IMPLANT
DRAPE INCISE IOBAN 66X45 STRL (DRAPES) ×1 IMPLANT
DRAPE STERI IOBAN 125X83 (DRAPES) ×1 IMPLANT
DRAPE U-SHAPE 47X51 STRL (DRAPES) ×6 IMPLANT
DRSG AQUACEL AG ADV 3.5X10 (GAUZE/BANDAGES/DRESSINGS) ×1 IMPLANT
DURAPREP 26ML APPLICATOR (WOUND CARE) ×4 IMPLANT
ELECT BLADE 4.0 EZ CLEAN MEGAD (MISCELLANEOUS) ×2
ELECT REM PT RETURN 9FT ADLT (ELECTROSURGICAL) ×2
ELECTRODE BLDE 4.0 EZ CLN MEGD (MISCELLANEOUS) IMPLANT
ELECTRODE REM PT RTRN 9FT ADLT (ELECTROSURGICAL) ×2 IMPLANT
FACESHIELD WRAPAROUND (MASK) ×2 IMPLANT
FACESHIELD WRAPAROUND OR TEAM (MASK) ×2 IMPLANT
GLOVE BIOGEL PI IND STRL 8 (GLOVE) ×2 IMPLANT
GLOVE BIOGEL PI INDICATOR 8 (GLOVE) ×2
GLOVE INDICATOR 7.0 STRL GRN (GLOVE) ×1 IMPLANT
GLOVE SURG SS PI 6.5 STRL IVOR (GLOVE) ×2 IMPLANT
GLOVE SURG SS PI 8.0 STRL IVOR (GLOVE) ×2 IMPLANT
GOWN STRL REUS W/ TWL LRG LVL3 (GOWN DISPOSABLE) ×2 IMPLANT
GOWN STRL REUS W/ TWL XL LVL3 (GOWN DISPOSABLE) ×2 IMPLANT
GOWN STRL REUS W/TWL LRG LVL3 (GOWN DISPOSABLE) ×4
GOWN STRL REUS W/TWL XL LVL3 (GOWN DISPOSABLE) ×2
HANDPIECE INTERPULSE COAX TIP (DISPOSABLE) ×2
HEAD FEM STD 32X+1 STRL (Hips) ×1 IMPLANT
HOOD PEEL AWAY FACE SHEILD DIS (HOOD) ×6 IMPLANT
IMMOBILIZER KNEE 20 (SOFTGOODS) IMPLANT
IMMOBILIZER KNEE 20 THIGH 36 (SOFTGOODS) IMPLANT
JET LAVAGE IRRISEPT WOUND (IRRIGATION / IRRIGATOR) ×2
KIT BASIN OR (CUSTOM PROCEDURE TRAY) ×3 IMPLANT
KIT TURNOVER KIT B (KITS) ×3 IMPLANT
LAVAGE JET IRRISEPT WOUND (IRRIGATION / IRRIGATOR) IMPLANT
LINER ACET 32X48 (Liner) ×1 IMPLANT
MANIFOLD NEPTUNE II (INSTRUMENTS) ×3 IMPLANT
NEEDLE HYPO 22GX1.5 SAFETY (NEEDLE) ×1 IMPLANT
NS IRRIG 1000ML POUR BTL (IV SOLUTION) ×3 IMPLANT
PACK TOTAL JOINT (CUSTOM PROCEDURE TRAY) ×3 IMPLANT
PACK UNIVERSAL I (CUSTOM PROCEDURE TRAY) ×3 IMPLANT
PAD ARMBOARD 7.5X6 YLW CONV (MISCELLANEOUS) ×6 IMPLANT
PILLOW ABDUCTION MEDIUM (MISCELLANEOUS) IMPLANT
PINNSECTOR W/GRIP ACE CUP 48MM (Joint) ×2 IMPLANT
RETRACTOR WND ALEXIS 18 MED (MISCELLANEOUS) IMPLANT
RTRCTR WOUND ALEXIS 18CM MED (MISCELLANEOUS) ×2
SCREW 6.5MMX25MM (Screw) ×1 IMPLANT
SET HNDPC FAN SPRY TIP SCT (DISPOSABLE) ×2 IMPLANT
SPONGE T-LAP 18X18 ~~LOC~~+RFID (SPONGE) ×1 IMPLANT
STAPLER VISISTAT 35W (STAPLE) ×3 IMPLANT
STEM FEMORAL SZ 5MM STD ACTIS (Stem) ×1 IMPLANT
STRIP CLOSURE SKIN 1/2X4 (GAUZE/BANDAGES/DRESSINGS) ×1 IMPLANT
SUCTION FRAZIER HANDLE 10FR (MISCELLANEOUS) ×2
SUCTION TUBE FRAZIER 10FR DISP (MISCELLANEOUS) ×2 IMPLANT
SUT ETHIBOND NAB CT1 #1 30IN (SUTURE) ×11 IMPLANT
SUT MNCRL AB 3-0 PS2 27 (SUTURE) ×1 IMPLANT
SUT VIC AB 0 CT1 27 (SUTURE) ×6
SUT VIC AB 0 CT1 27XBRD ANBCTR (SUTURE) ×8 IMPLANT
SUT VIC AB 1 CT1 27 (SUTURE) ×10
SUT VIC AB 1 CT1 27XBRD ANBCTR (SUTURE) ×12 IMPLANT
SUT VIC AB 2-0 CT1 27 (SUTURE) ×4
SUT VIC AB 2-0 CT1 TAPERPNT 27 (SUTURE) ×8 IMPLANT
SYR 30ML LL (SYRINGE) ×1 IMPLANT
SYR TB 1ML LUER SLIP (SYRINGE) ×1 IMPLANT
TOWEL GREEN STERILE (TOWEL DISPOSABLE) ×3 IMPLANT
TRAY CATH INTERMITTENT SS 16FR (CATHETERS) ×1 IMPLANT
WATER STERILE IRR 1000ML POUR (IV SOLUTION) ×9 IMPLANT

## 2021-05-21 NOTE — Brief Op Note (Signed)
   05/20/2021 - 05/21/2021  10:15 AM  PATIENT:  Sarah Rojas  81 y.o. female  PRE-OPERATIVE DIAGNOSIS:  LEFT FEMORAL NECK FRACTURE  POST-OPERATIVE DIAGNOSIS:  LEFT FEMORAL NECK FRACTURE  PROCEDURE:  Procedure(s): TOTAL HIP ARTHROPLASTY ANTERIOR APPROACH  SURGEON:  Surgeon(s): Meredith Pel, MD  ASSISTANT: magnant pa  ANESTHESIA:   spinal  EBL: 100 ml    Total I/O In: 1000 [I.V.:1000] Out: 100 [Blood:100]  BLOOD ADMINISTERED: none  DRAINS: none   LOCAL MEDICATIONS USED:  marcaine mso4 clonidine  SPECIMEN:  No Specimen  COUNTS:  YES  TOURNIQUET:  * No tourniquets in log *  DICTATION: .Other Dictation: Dictation Number [id number]  PLAN OF CARE: Admit to inpatient   PATIENT DISPOSITION:  PACU - hemodynamically stable

## 2021-05-21 NOTE — ED Notes (Addendum)
This RN gave report to Manuela Schwartz, RN in short stay. Pt in hospital gown, belongs placed in belongings bag and transported with patient to [id number].

## 2021-05-21 NOTE — ED Notes (Signed)
Patient arrives with [facility] from [facility] for surgery. Pt had mechanical fall sometime between Sunday and Wednesday (pt does not recall exactly when); pt has displaced left femoral neck fx, given 50 mcg Fentanyl prior to transport. Patient has latex allergy.

## 2021-05-21 NOTE — ED Notes (Signed)
Entered room to find pt resting with eyes closed; RR even and unlabored on 2L O2 via [place] -- easily aroused with verbal stimulation -- GCS 15; able to answer all questions appropriately and follow simple commands-- at this time pt assisted out of her pants and adult brief being worn (moderate amount of urine in brief) -- pericare then performed and purewick placed; pt agreeable to use purewick - clean adult brief then placed to help keep purewick secure and blue chux pad placed under patient.  Pt reports movement exacerbated L hip pain; now rated 8/10 - will notify ED attending and request pain med order

## 2021-05-21 NOTE — Progress Notes (Signed)
  Carryover admission to the Day Admitter. Please see Dr. Juel Burrow transfer documentation under hospitalist communication for additional details.   Briefly, this is a 81 year old female who presents as transfer from [facility] with acute left hip fracture following a reported mechanical fall earlier in the week, with imaging at [facility] revealing left hip fracture.  Dr. Marlou Sa of Orthopedic surgery has been consulted, and is planning to take patient to OR today for definitive surgical correction of left hip fracture.  Dr. Nevada Crane subsequently accepted patient for inpatient admission to [facility] to surgical telemetry unit.  Upon arrival at [facility], I have I have placed some additional preliminary admit orders via the adult multi-morbid admission order set. I have also ordered n.p.o. status in the setting of impending plan for surgical procedure.  Prn IV fentanyl.  SCDs.  Existing order for urinalysis from [facility] is noted.  Also, ordered routine morning labs as well as INR.  Preoperative EKG completed.    Babs Bertin, DO Hospitalist

## 2021-05-21 NOTE — Plan of Care (Signed)

## 2021-05-21 NOTE — Consult Note (Signed)
Reason for Consult: Left hip pain Referring Physician: Dr. Lanice Shirts is an 81 y.o. female.  HPI: Sarah Rojas is an ambulatory 81 year old female who has been using a walker recently who sustained a fall.  Denies any other orthopedic injuries.  CT scan of the head negative for intracranial abnormality which is acute.  Was noted to have displaced left femoral neck fracture.  Patient is an active.  She has reasonable family support network at home.  Denies any history of DVT or pulmonary embolism.  Past Medical History:  Diagnosis Date   Cancer ([place])    COPD (chronic obstructive pulmonary disease) ([place])    High cholesterol    Uterine cancer ([place]) 1970    Past Surgical History:  Procedure Laterality Date   ABDOMINAL HYSTERECTOMY      Family History  Problem Relation Age of Onset   Diabetes Mellitus II Daughter     Social History:  reports that she has been smoking cigarettes. She has been smoking an average of .5 packs per day. She has never used smokeless tobacco. She reports that she does not drink alcohol and does not use drugs.  Allergies:  Allergies  Allergen Reactions   Latex Rash    Medications: I have reviewed the patient's current medications.  Results for orders placed or performed during the hospital encounter of 05/20/21 (from the past 48 hour(s))  CBC with Differential/Platelet     Status: None   Collection Time: 05/20/21  6:52 PM  Result Value Ref Range   WBC 9.7 4.0 - 10.5 K/uL   RBC 3.98 3.87 - 5.11 MIL/uL   Hemoglobin 12.3 12.0 - 15.0 g/dL   HCT 38.1 36.0 - 46.0 %   MCV 95.7 80.0 - 100.0 fL   MCH 30.9 26.0 - 34.0 pg   MCHC 32.3 30.0 - 36.0 g/dL   RDW 14.0 11.5 - 15.5 %   Platelets 209 150 - 400 K/uL   nRBC 0.0 0.0 - 0.2 %   Neutrophils Relative % 73 %   Neutro Abs 7.1 1.7 - 7.7 K/uL   Lymphocytes Relative 13 %   Lymphs Abs 1.3 0.7 - 4.0 K/uL   Monocytes Relative 9 %   Monocytes Absolute 0.8 0.1 - 1.0 K/uL   Eosinophils Relative 4 %    Eosinophils Absolute 0.4 0.0 - 0.5 K/uL   Basophils Relative 1 %   Basophils Absolute 0.1 0.0 - 0.1 K/uL   Immature Granulocytes 0 %   Abs Immature Granulocytes 0.04 0.00 - 0.07 K/uL    Comment: Performed at [facility], [place], [place] [address]  Comprehensive metabolic panel     Status: Abnormal   Collection Time: 05/20/21  6:52 PM  Result Value Ref Range   Sodium 136 135 - 145 mmol/L   Potassium 3.6 3.5 - 5.1 mmol/L   Chloride 103 98 - 111 mmol/L   CO2 23 22 - 32 mmol/L   Glucose, Bld 84 70 - 99 mg/dL    Comment: Glucose reference range applies only to samples taken after fasting for at least 8 hours.   BUN 27 (H) 8 - 23 mg/dL   Creatinine, Ser 1.36 (H) 0.44 - 1.00 mg/dL   Calcium 8.7 (L) 8.9 - 10.3 mg/dL   Total Protein 6.8 6.5 - 8.1 g/dL   Albumin 3.8 3.5 - 5.0 g/dL   AST 32 15 - 41 U/L   ALT 16 0 - 44 U/L   Alkaline Phosphatase 61  38 - 126 U/L   Total Bilirubin 0.3 0.3 - 1.2 mg/dL   GFR, Estimated 39 (L) >60 mL/min    Comment: (NOTE) Calculated using the CKD-EPI Creatinine Equation (2021)    Anion gap 10 5 - 15    Comment: Performed at [facility], [place], [place] [address]    CT Head Wo Contrast  Result Date: 05/20/2021 CLINICAL DATA:  Head trauma, moderate-severe EXAM: CT HEAD WITHOUT CONTRAST TECHNIQUE: Contiguous axial images were obtained from the base of the skull through the vertex without intravenous contrast. RADIATION DOSE REDUCTION: This exam was performed according to the departmental dose-optimization program which includes automated exposure control, adjustment of the mA and/or kV according to patient size and/or use of iterative reconstruction technique. COMPARISON:  05/18/2019 FINDINGS: Brain: No evidence of acute infarction, hemorrhage, hydrocephalus, extra-axial collection or mass lesion/mass effect. Scattered low-density changes within the periventricular and subcortical white matter  compatible with chronic microvascular ischemic change. Mild diffuse cerebral volume loss. Vascular: Atherosclerotic calcifications involving the large vessels of the skull base. No unexpected hyperdense vessel. Skull: Normal. Negative for fracture or focal lesion. Sinuses/Orbits: No acute finding. Other: None. IMPRESSION: 1. No acute intracranial abnormality. 2. Chronic microvascular ischemic change and cerebral volume loss. Electronically Signed   By: Davina Poke D.O.   On: 05/20/2021 18:53   DG Chest Port 1 View  Result Date: 05/20/2021 CLINICAL DATA:  Fall. EXAM: PORTABLE CHEST 1 VIEW COMPARISON:  Chest x-ray 05/19/2019.  Chest CT 03/17/2020. FINDINGS: Linear scarring is again seen bilaterally. There is a questionable small focal opacity in the right upper lobe which was not seen on the prior study. This is slightly nodular. There is no lung consolidation, pleural effusion or pneumothorax. Cardiomediastinal silhouette is within normal limits. No fractures are seen. IMPRESSION: 1. Questionable small/nodular density in the right upper lobe. Recommend follow-up nonemergent chest CT. 2. Stable areas of bilateral scarring. Electronically Signed   By: Ronney Asters M.D.   On: 05/20/2021 18:53   DG Hip Unilat With Pelvis 2-3 Views Left  Result Date: 05/20/2021 CLINICAL DATA:  Fall.  Pain in the left hip. EXAM: DG HIP (WITH OR WITHOUT PELVIS) 2-3V LEFT COMPARISON:  None Available. FINDINGS: There is an acute fracture through the subcapital left femoral neck region. There is superolateral displacement of the distal fracture fragment. There is no dislocation. Joint spaces are well maintained. Peripheral vascular calcifications are present. IMPRESSION: 1. Displaced left femoral neck fracture. Electronically Signed   By: Ronney Asters M.D.   On: 05/20/2021 18:27    Review of Systems  Musculoskeletal:  Positive for arthralgias.  All other systems reviewed and are negative. Blood pressure (!) 104/50, pulse  63, temperature 97.7 F (36.5 C), temperature source Oral, resp. rate 13, height 5' 0.98" (1.549 m), weight 44.5 kg, SpO2 96 %. Physical Exam Vitals reviewed.  HENT:     Head: Normocephalic.     Nose: Nose normal.     Mouth/Throat:     Mouth: Mucous membranes are moist.  Eyes:     Pupils: Pupils are equal, round, and reactive to light.  Cardiovascular:     Rate and Rhythm: Normal rate.     Pulses: Normal pulses.  Pulmonary:     Effort: Pulmonary effort is normal.  Abdominal:     General: Abdomen is flat.  Musculoskeletal:     Cervical back: Normal range of motion.  Skin:    General: Skin is warm.     Capillary  Refill: Capillary refill takes less than 2 seconds.  Neurological:     General: No focal deficit present.     Mental Status: She is alert.  Psychiatric:        Mood and Affect: Mood normal.  Left hip demonstrates pain with range of motion.  No knee effusion.  Ankle dorsiflexion intact.  Pedal pulses intact.  Right lower extremity as well as bilateral upper extremities have good range of motion in the wrist elbows and shoulders as well as the right ankle hip and knee.  Assessment/Plan: Impression is left hip fracture displaced femoral neck fracture.  Plan is total hip replacement in this active 81 year old patient.  The risk and benefits of the procedure are discussed with the patient including but not limited to infection nerve or vessel damage incomplete restoration of function as well as continued pain as well as instability and unequal leg lengths.  Patient understands the risk and benefits and wishes to proceed.  All questions answered denies any personal or family history of DVT or pulmonary embolism.  G Scott Reakwon Barren 05/21/2021, 7:28 AM

## 2021-05-21 NOTE — H&P (Signed)
History and Physical    Patient: Sarah Rojas DOB: 1940-07-30 DOA: 05/20/2021 DOS: the patient was seen and examined on 05/21/2021 PCP: Brand Males, MD  Patient coming from: Home - lives alone; NOK: [place], [place], [phone]   Chief Complaint: Fall  HPI: Sarah Rojas is a 81 y.o. female with medical history significant of COPD; remote uterine cancer; and HLD presenting with a fall.   She reports that she had family visiting her last Sunday (5/14) for Mother's Day.  After they left, she was walking back into the house and she stumbled over a step, landing on her left hip.  She has had persistent pain and difficulty with ambulation since but thought it was just a bruise.  No other injuries.  She denies memory issues, although there is report of concern about this in the chart.      ER Course:  [facility] transfer, per Dr. Velia Meyer:  81 year old female who presents as transfer from [place] with acute left hip fracture following a reported mechanical fall earlier in the week, with imaging at [facility] revealing left hip fracture.  Dr. Marlou Sa of Orthopedic surgery has been consulted, and is planning to take patient to OR today for definitive surgical correction of left hip fracture.     Review of Systems: As mentioned in the history of present illness. All other systems reviewed and are negative. Past Medical History:  Diagnosis Date   COPD (chronic obstructive pulmonary disease) ([place])    High cholesterol    Uterine cancer ([place]) 1970   Past Surgical History:  Procedure Laterality Date   ABDOMINAL HYSTERECTOMY     Social History:  reports that she has been smoking cigarettes. She has a 65.00 pack-year smoking history. She has never used smokeless tobacco. She reports that she does not drink alcohol and does not use drugs.  Allergies  Allergen Reactions   Latex Rash    Family History  Problem Relation Age of Onset   Diabetes Mellitus II  Daughter     Prior to Admission medications   Medication Sig Start Date End Date Taking? Authorizing Provider  albuterol (VENTOLIN HFA) 108 (90 Base) MCG/ACT inhaler Inhale 1 puff into the lungs every 4 (four) hours as needed.  01/28/19   [provider]  Aspirin-Salicylamide-Caffeine (BC HEADACHE POWDER PO) Take 1 Package by mouth daily as needed (for headache).     [provider]  diazepam (VALIUM) 5 MG tablet Take 5 mg by mouth 3 (three) times daily as needed. 05/06/19   [provider]  fluticasone (FLONASE) 50 MCG/ACT nasal spray Place 1 spray into both nostrils daily as needed for allergies or rhinitis.  02/05/19   [provider]  Multiple Vitamin (MULTIVITAMIN WITH MINERALS) TABS tablet Take 1 tablet by mouth daily.    [provider]  rosuvastatin (CRESTOR) 5 MG tablet Take 5 mg by mouth daily. 03/13/19   [provider]    Physical Exam: Vitals:   05/21/21 0600 05/21/21 0615 05/21/21 0625 05/21/21 0709  BP: (!) 134/58 (!) 105/45 (!) 104/50   Pulse: 72 68 63   Resp: '17 12 13   '$ Temp:   97.7 F (36.5 C)   TempSrc:   Oral   SpO2: 98% 96% 96%   Weight:    44.5 kg  Height:    5' 0.98" (1.549 m)   General:  Appears calm and comfortable and is in NAD; L hip pain with movement Eyes:   EOMI,  normal lids, iris ENT:  grossly normal lips & tongue, mmm; some absent dentition Neck:  no LAD, masses or thyromegaly Cardiovascular:  RRR, no m/r/g. No LE edema.  Respiratory:   CTA bilaterally with no wheezes/rales/rhonchi.  Normal respiratory effort.   Abdomen:  soft, NT, ND Skin:  no rash or induration seen on limited exam Musculoskeletal:  L leg is shortened compared to R; no obvious deformity on visual exam of LLE Psychiatric:  grossly normal mood and affect, speech fluent and appropriate, A&O x 3 Neurologic:  CN 2-12 grossly intact, moves all extremities in coordinated fashion other than LLE   Radiological Exams on  Admission: Independently reviewed - see discussion in A/P where applicable  CT Head Wo Contrast  Result Date: 05/20/2021 CLINICAL DATA:  Head trauma, moderate-severe EXAM: CT HEAD WITHOUT CONTRAST TECHNIQUE: Contiguous axial images were obtained from the base of the skull through the vertex without intravenous contrast. RADIATION DOSE REDUCTION: This exam was performed according to the departmental dose-optimization program which includes automated exposure control, adjustment of the mA and/or kV according to patient size and/or use of iterative reconstruction technique. COMPARISON:  05/18/2019 FINDINGS: Brain: No evidence of acute infarction, hemorrhage, hydrocephalus, extra-axial collection or mass lesion/mass effect. Scattered low-density changes within the periventricular and subcortical white matter compatible with chronic microvascular ischemic change. Mild diffuse cerebral volume loss. Vascular: Atherosclerotic calcifications involving the large vessels of the skull base. No unexpected hyperdense vessel. Skull: Normal. Negative for fracture or focal lesion. Sinuses/Orbits: No acute finding. Other: None. IMPRESSION: 1. No acute intracranial abnormality. 2. Chronic microvascular ischemic change and cerebral volume loss. Electronically Signed   By: Davina Poke D.O.   On: 05/20/2021 18:53   DG Chest Port 1 View  Result Date: 05/20/2021 CLINICAL DATA:  Fall. EXAM: PORTABLE CHEST 1 VIEW COMPARISON:  Chest x-ray 05/19/2019.  Chest CT 03/17/2020. FINDINGS: Linear scarring is again seen bilaterally. There is a questionable small focal opacity in the right upper lobe which was not seen on the prior study. This is slightly nodular. There is no lung consolidation, pleural effusion or pneumothorax. Cardiomediastinal silhouette is within normal limits. No fractures are seen. IMPRESSION: 1. Questionable small/nodular density in the right upper lobe. Recommend follow-up nonemergent chest CT. 2. Stable areas of  bilateral scarring. Electronically Signed   By: Ronney Asters M.D.   On: 05/20/2021 18:53   DG Hip Unilat With Pelvis 2-3 Views Left  Result Date: 05/20/2021 CLINICAL DATA:  Fall.  Pain in the left hip. EXAM: DG HIP (WITH OR WITHOUT PELVIS) 2-3V LEFT COMPARISON:  None Available. FINDINGS: There is an acute fracture through the subcapital left femoral neck region. There is superolateral displacement of the distal fracture fragment. There is no dislocation. Joint spaces are well maintained. Peripheral vascular calcifications are present. IMPRESSION: 1. Displaced left femoral neck fracture. Electronically Signed   By: Ronney Asters M.D.   On: 05/20/2021 18:27    EKG: Independently reviewed.  NSR with rate 76; nonspecific ST changes with no evidence of acute ischemia   Labs on Admission: I have personally reviewed the available labs and imaging studies at the time of the admission.  Pertinent labs:    BUN 27/Creatinine 1.36/GFR 39; 16/1.14/46 in 2022 Normal CBC   Assessment and Plan: Principal Problem:   Displaced fracture of left femoral neck (HCC) Active Problems:   Tobacco abuse   COPD not affecting current episode of care [facility])   Dyslipidemia   Hypotension, chronic   Anxiety disorder  Chronic kidney disease, stage 3b (HCC)    Hip fracture -Apparently mechanical fall resulting in hip fracture -Orthopedics consulted -NPO in anticipation of surgical repair -SCDs overnight, start Lovenox post-operatively (or as per ortho) -Pain control with Tylenol, Robaxin, Oxycodone, and Morphine prn -TOC team consult for rehab placement -Will need PT consult post-operatively -Hip fracture order set utilized -TXA per orthopedics  Pre-operative stratification -Orthopedic/spinal surgery is associated with an intermediate (1-5%) cardiovascular risk for cardiac death and nonfatal MI -She has no diagnosed CVD RF (no known h/o MI or CHF) so her revised cardiac index gives a risk estimate of 3.9%,  class I risk -It is reasonable for her to go to the OR without additional evaluation  COPD -Not on home O2 -Uses albuterol inhaler prn -Possible nodule noted on CXR; needs CT non-emergently inpatient vs. Outpatient -Of note, during her last hospitalization in 05/2019 she was recommended for repeat chest CT in 11/2019; her most recent CT was in 03/2020 with evidence of COPD but no concerning findings  HLD -Continue Crestor -She does have evidence of CAD and aortic atherosclerosis on prior CTs and may benefit from cardiology f/u as an outpatient  Hypotension -She was previously prescribed Midodrine but has run out of this medication -Her BPs have been marginal in the ER -Will resume midodrine  Anxiety -Continue Valium TID prn  Stage 3 CKD -Uncertain current baseline, as last recorded value was in 2022 and it was (barely) stage 3a CKD -Currently stage 3b -Suspect that this is her baseline -Will recheck BMP in AM  Tobacco dependence -Encourage cessation.   -This was discussed with the patient and should be reviewed on an ongoing basis.   -Patch ordered      Advance Care Planning:   Code Status: Full Code - would not want prolonged life support  Consults: Orthopedics; SW, Nutrition; will need PT post-operatively   DVT Prophylaxis: SCDs until approved for Lovenox by orthopedics  Family Communication: None present; orthopedics will update family post-operatively  Severity of Illness: The appropriate patient status for this patient is INPATIENT. Inpatient status is judged to be reasonable and necessary in order to provide the required intensity of service to ensure the patient's safety. The patient's presenting symptoms, physical exam findings, and initial radiographic and laboratory data in the context of their chronic comorbidities is felt to place them at high risk for further clinical deterioration. Furthermore, it is not anticipated that the patient will be medically stable for  discharge from the hospital within 2 midnights of admission.   * I certify that at the point of admission it is my clinical judgment that the patient will require inpatient hospital care spanning beyond 2 midnights from the point of admission due to high intensity of service, high risk for further deterioration and high frequency of surveillance required.*  Author: Karmen Bongo, MD 05/21/2021 8:15 AM  For on call review [email or web address].

## 2021-05-21 NOTE — Anesthesia Preprocedure Evaluation (Addendum)
Anesthesia Evaluation  Patient identified by MRN, date of birth, ID band Patient awake    Reviewed: Allergy & Precautions, H&P , NPO status , Patient's Chart, lab work & pertinent test results  Airway Mallampati: II  TM Distance: >3 FB Neck ROM: Full    Dental no notable dental hx. (+) Teeth Intact, Dental Advisory Given   Pulmonary COPD,  COPD inhaler, Current Smoker and Patient abstained from smoking.,    Pulmonary exam normal breath sounds clear to auscultation       Cardiovascular negative cardio ROS   Rhythm:Regular Rate:Normal     Neuro/Psych CVA negative psych ROS   GI/Hepatic negative GI ROS, Neg liver ROS,   Endo/Other  negative endocrine ROS  Renal/GU negative Renal ROS  negative genitourinary   Musculoskeletal   Abdominal   Peds  Hematology negative hematology ROS (+)   Anesthesia Other Findings   Reproductive/Obstetrics negative OB ROS                            Anesthesia Physical Anesthesia Plan  ASA: 2  Anesthesia Plan: Spinal and MAC   Post-op Pain Management: Ofirmev IV (intra-op)*   Induction: Intravenous  PONV Risk Score and Plan: 2 and Propofol infusion and Treatment may vary due to age or medical condition  Airway Management Planned: Natural Airway and Simple Face Mask  Additional Equipment:   Intra-op Plan:   Post-operative Plan:   Informed Consent: I have reviewed the patients History and Physical, chart, labs and discussed the procedure including the risks, benefits and alternatives for the proposed anesthesia with the patient or authorized representative who has indicated his/her understanding and acceptance.     Dental advisory given  Plan Discussed with: CRNA  Anesthesia Plan Comments:         Anesthesia Quick Evaluation

## 2021-05-21 NOTE — Op Note (Signed)
NAME: Sarah Rojas, Sarah Rojas. MEDICAL RECORD NO: [id number] ACCOUNT NO: [id number] DATE OF BIRTH: February 14, 1940 FACILITY: [facility] LOCATION: [id number] PHYSICIAN: Yetta Barre. Marlou Sa, MD  Operative Report   PREOPERATIVE DIAGNOSIS:  Left displaced femoral neck fracture.  POSTOPERATIVE DIAGNOSIS:  Left displaced femoral neck fracture.  PROCEDURE:  Left displaced femoral neck fracture, total hip replacement using DePuy ACTIS stem size 5 with standard neck, +1 stainless steel articular head 32 mm and Gription acetabular cup measuring 48 mm with standard liner and one cancellous bone  screw 25 mm.  SURGEON:  Yetta Barre. Marlou Sa, MD  ASSISTANT:  Annie Main, PA.  INDICATIONS:  The patient is an ambulatory 81 year old patient with left hip fracture, who presents for operative management after explanation of risks and benefits.  She does maintain an active life ____ independent living.  DESCRIPTION OF PROCEDURE:  The patient was brought to the operating room where spinal anesthetic was induced.  Preoperative antibiotics administered.  Timeout was called.  The patient was placed on the [place] bed.  Left hip was prescrubbed with alcohol and  Betadine, allowed to air dry, prepped with DuraPrep solution and draped with a wall drape.  Timeout was called.  Incision was made 2 cm distal and posterior to the anterior superior iliac crest.  Skin and subcutaneous tissue were sharply divided.   Fascia lata was encountered.  Soft tissue including branches of the lateral femoral cutaneous nerve were swept superiorly.  Incision was then made along the fascia of the tensor fascia lata.  Plane was then developed between the tensor fascia lata and  the rectus.  Crossing circumflex vessels were carefully ligated.  Acetabular retractor was then placed on the superior aspect of the femoral neck.  Using a Cobb elevator, soft tissue dissection was performed of the anterior capsule. A second acetabular  retractor was placed.  Capsulotomy T-shaped  was then made and marked with #1 Ethibond suture.  The flaps were developed.  Traction was placed on the femoral neck and leg.  Head was removed.  Femoral neck cut was then made with the oscillating saw.   Acetabulum pulvinar was removed.  Labrum was removed.  Reaming was performed up to size 48 in approximately 45 degrees of abduction and 15 degrees of anteversion.  This was done under fluoroscopy.  Good bleeding bone was encountered.  A 48 Gription cup  was placed with good purchase obtained.  One screw was placed.  Standard liner was placed based on preoperative evaluation of offset for the right hip.  Next, the femur was then broached up to size 5.  5 trial was placed along with a +1, 32 mm femoral  head.  This gave very good stability with external rotation to 90 degrees and extension to 60 degrees. Soft tissue tension was also good.  Flexion and internal rotation also did not result in any posterior instability. True components placed after  thorough irrigation.  Same stability parameters were maintained.  Under fluoroscopic evaluation, the sizing and leg lengths were correct.  Thorough irrigation was performed.  It should be noted that IrriSept solution utilized after the incision and after  the arthrotomy at multiple times during the case including filling of the femoral canal with IrriSept solution, suctioning out and placing vancomycin powder into the canal prior to placement of the stem.  Next, the capsule was closed using #1 Vicryl  suture.  Capsule anesthetized using a combination of Marcaine, morphine, clonidine.  Dead space was then closed.  The fascia was then closed  using #1 Vicryl suture followed by interrupted inverted 0 Vicryl suture, 2-0 Vicryl suture, and 3-0 Monocryl with  Steri-Strips applied.  Aquacel placed.  Luke's assistance was required at all times for retraction, opening, closing, limb positioning, acetabular retraction, reaming and broaching.  His assistance was required at all  times during the case.   NIK D: 05/21/2021 10:21:39 am T: 05/21/2021 9:09:00 pm  JOB: [id number]/ [id number]

## 2021-05-21 NOTE — ED Provider Notes (Signed)
Pt will be transferred to [facility] ER for surgery later this morning.  Accepted by dr Luretha Murphy, MD 05/21/21 [phone]

## 2021-05-21 NOTE — Anesthesia Postprocedure Evaluation (Signed)
Anesthesia Post Note  Patient: Sarah Rojas  Procedure(s) Performed: TOTAL HIP ARTHROPLASTY ANTERIOR APPROACH (Left: Hip)     Patient location during evaluation: PACU Anesthesia Type: MAC and Spinal Level of consciousness: oriented and awake and alert Pain management: pain level controlled Vital Signs Assessment: post-procedure vital signs reviewed and stable Respiratory status: spontaneous breathing, respiratory function stable and patient connected to nasal cannula oxygen Cardiovascular status: blood pressure returned to baseline and stable Postop Assessment: no headache, no backache, no apparent nausea or vomiting, spinal receding and patient able to bend at knees Anesthetic complications: no   No notable events documented.  Last Vitals:  Vitals:   05/21/21 1140 05/21/21 1205  BP: (!) 105/54 (!) 117/56  Pulse: 85 70  Resp: 16 15  Temp: 36.5 C   SpO2: 97% 97%    Last Pain:  Vitals:   05/21/21 1140  TempSrc:   PainSc: 5                  Zachory Mangual,W. EDMOND

## 2021-05-21 NOTE — Anesthesia Procedure Notes (Signed)
Spinal  Patient location during procedure: OR Start time: 05/21/2021 8:04 AM End time: 05/21/2021 8:09 AM Reason for block: surgical anesthesia Staffing Performed: anesthesiologist  Anesthesiologist: Roderic Palau, MD Preanesthetic Checklist Completed: patient identified, IV checked, risks and benefits discussed, surgical consent, monitors and equipment checked, pre-op evaluation and timeout performed Spinal Block Patient position: right lateral decubitus Prep: DuraPrep Patient monitoring: cardiac monitor, continuous pulse ox and blood pressure Approach: midline Location: L3-4 Injection technique: single-shot Needle Needle type: Quincke  Needle gauge: 22 G Needle length: 9 cm Assessment Sensory level: T8 Events: CSF return Additional Notes Functioning IV was confirmed and monitors were applied. Sterile prep and drape, including hand hygiene and sterile gloves were used. The patient was positioned and the spine was prepped. The skin was anesthetized with lidocaine.  Free flow of clear CSF was obtained prior to injecting local anesthetic into the CSF.  The spinal needle aspirated freely following injection.  The needle was carefully withdrawn.  The patient tolerated the procedure well.

## 2021-05-21 NOTE — Transfer of Care (Signed)
Immediate Anesthesia Transfer of Care Note  Patient: Sarah Rojas  Procedure(s) Performed: TOTAL HIP ARTHROPLASTY ANTERIOR APPROACH (Left: Hip)  Patient Location: PACU  Anesthesia Type:MAC and Spinal  Level of Consciousness: awake, alert , oriented and patient cooperative  Airway & Oxygen Therapy: Patient Spontanous Breathing and Patient connected to nasal cannula oxygen  Post-op Assessment: Report given to RN, Post -op Vital signs reviewed and stable and Patient moving all extremities X 4  Post vital signs: Reviewed and stable  Last Vitals:  Vitals Value Taken Time  BP 96/41 05/21/21 1037  Temp    Pulse 84 05/21/21 1040  Resp 13 05/21/21 1040  SpO2 93 % 05/21/21 1040  Vitals shown include unvalidated device data.  Last Pain:  Vitals:   05/21/21 0625  TempSrc: Oral  PainSc:          Complications: No notable events documented.

## 2021-05-21 NOTE — Anesthesia Procedure Notes (Signed)
Procedure Name: MAC Date/Time: 05/21/2021 8:00 AM Performed by: Claris Che, CRNA Pre-anesthesia Checklist: Patient identified, Emergency Drugs available, Suction available, Patient being monitored and Timeout performed Patient Re-evaluated:Patient Re-evaluated prior to induction Oxygen Delivery Method: Simple face mask Placement Confirmation: positive ETCO2 Dental Injury: Teeth and Oropharynx as per pre-operative assessment

## 2021-05-22 ENCOUNTER — Encounter (HOSPITAL_COMMUNITY): Payer: Self-pay | Admitting: Orthopedic Surgery

## 2021-05-22 DIAGNOSIS — I9589 Other hypotension: Secondary | ICD-10-CM

## 2021-05-22 DIAGNOSIS — N1832 Chronic kidney disease, stage 3b: Secondary | ICD-10-CM

## 2021-05-22 DIAGNOSIS — S72002A Fracture of unspecified part of neck of left femur, initial encounter for closed fracture: Secondary | ICD-10-CM | POA: Diagnosis not present

## 2021-05-22 DIAGNOSIS — E43 Unspecified severe protein-calorie malnutrition: Secondary | ICD-10-CM | POA: Insufficient documentation

## 2021-05-22 DIAGNOSIS — E785 Hyperlipidemia, unspecified: Secondary | ICD-10-CM

## 2021-05-22 DIAGNOSIS — J449 Chronic obstructive pulmonary disease, unspecified: Secondary | ICD-10-CM | POA: Diagnosis not present

## 2021-05-22 DIAGNOSIS — Z720 Tobacco use: Secondary | ICD-10-CM

## 2021-05-22 LAB — BASIC METABOLIC PANEL
Anion gap: 3 — ABNORMAL LOW (ref 5–15)
BUN: 22 mg/dL (ref 8–23)
CO2: 26 mmol/L (ref 22–32)
Calcium: 8 mg/dL — ABNORMAL LOW (ref 8.9–10.3)
Chloride: 106 mmol/L (ref 98–111)
Creatinine, Ser: 1.4 mg/dL — ABNORMAL HIGH (ref 0.44–1.00)
GFR, Estimated: 38 mL/min — ABNORMAL LOW (ref >60)
Glucose, Bld: 203 mg/dL — ABNORMAL HIGH (ref 70–99)
Potassium: 4.9 mmol/L (ref 3.5–5.1)
Sodium: 135 mmol/L (ref 135–145)

## 2021-05-22 LAB — CBC
HCT: 31.1 % — ABNORMAL LOW (ref 36.0–46.0)
Hemoglobin: 9.8 g/dL — ABNORMAL LOW (ref 12.0–15.0)
MCH: 31.8 pg (ref 26.0–34.0)
MCHC: 31.5 g/dL (ref 30.0–36.0)
MCV: 101 fL — ABNORMAL HIGH (ref 80.0–100.0)
Platelets: 195 10*3/uL (ref 150–400)
RBC: 3.08 MIL/uL — ABNORMAL LOW (ref 3.87–5.11)
RDW: 13.5 % (ref 11.5–15.5)
WBC: 10.6 10*3/uL — ABNORMAL HIGH (ref 4.0–10.5)
nRBC: 0 % (ref 0.0–0.2)

## 2021-05-22 MED ORDER — CHLORHEXIDINE GLUCONATE CLOTH 2 % EX PADS
6.0000 | MEDICATED_PAD | Freq: Every day | CUTANEOUS | Status: DC
Start: 2021-05-22 — End: 2021-05-25
  Administered 2021-05-22 – 2021-05-25 (×4): 6 via TOPICAL

## 2021-05-22 MED ORDER — ENSURE ENLIVE PO LIQD
237.0000 mL | Freq: Three times a day (TID) | ORAL | Status: DC
  Administered 2021-05-22 – 2021-05-24 (×6): 237 mL via ORAL

## 2021-05-22 MED ORDER — MUPIROCIN 2 % EX OINT
1.0000 | TOPICAL_OINTMENT | Freq: Two times a day (BID) | CUTANEOUS | Status: DC
Start: 2021-05-22 — End: 2021-05-25
  Administered 2021-05-22 – 2021-05-25 (×8): 1 via NASAL
  Filled 2021-05-22 (×2): qty 22

## 2021-05-22 MED ORDER — ADULT MULTIVITAMIN W/MINERALS CH
1.0000 | ORAL_TABLET | Freq: Every day | ORAL | Status: DC
  Administered 2021-05-22 – 2021-05-25 (×4): 1 via ORAL
  Filled 2021-05-22 (×4): qty 1

## 2021-05-22 NOTE — Plan of Care (Signed)

## 2021-05-22 NOTE — Progress Notes (Addendum)
Initial Nutrition Assessment  DOCUMENTATION CODES:   Severe malnutrition in context of chronic illness  INTERVENTION:   Ensure Enlive po TID, each supplement provides 350 kcal and 20 grams of protein.  MVI with minerals daily.  NUTRITION DIAGNOSIS:   Severe Malnutrition related to chronic illness (COPD) as evidenced by severe muscle depletion, severe fat depletion.  GOAL:   Patient will meet greater than or equal to 90% of their needs  MONITOR:   PO intake, Supplement acceptance  REASON FOR ASSESSMENT:   Consult Hip fracture protocol  ASSESSMENT:   81 yo female admitted with L femoral neck fracture. PMH includes COPD, HLD, uterine cancer, smoker.  S/P THA 5/21.  Patient reports poor intake since admission. PTA, she ate what she wanted to. She would drink an Ensure/Boost drink every now and then, but not all the time, and takes a Centrum Silver MVI daily. She prefers strawberry. She has not noted any weight loss.   Labs reviewed.  Medications reviewed and include Colace. IVF: LR at 10 ml/h.  No weight history available for review.   Patient meets criteria for severe malnutrition with severe depletion of muscle and subcutaneous fat mass.   NUTRITION - FOCUSED PHYSICAL EXAM:  Flowsheet Row Most Recent Value  Orbital Region Moderate depletion  Upper Arm Region Severe depletion  Thoracic and Lumbar Region Severe depletion  Buccal Region Severe depletion  Temple Region Severe depletion  Clavicle Bone Region Severe depletion  Clavicle and Acromion Bone Region Severe depletion  Scapular Bone Region Severe depletion  Dorsal Hand Severe depletion  Patellar Region Severe depletion  Anterior Thigh Region Severe depletion  Posterior Calf Region Severe depletion  Edema (RD Assessment) None  Hair Reviewed  Eyes Reviewed  Mouth Reviewed  Skin Reviewed  Nails Reviewed       Diet Order:   Diet Order             Diet regular Room service appropriate? Yes; Fluid  consistency: Thin  Diet effective now                   EDUCATION NEEDS:   No education needs have been identified at this time  Skin:  Skin Assessment: Reviewed RN Assessment  Last BM:  no BM documented  Height:   Ht Readings from Last 1 Encounters:  05/21/21 5' 0.98" (1.549 m)    Weight:   Wt Readings from Last 1 Encounters:  05/21/21 44.5 kg     BMI:  Body mass index is 18.55 kg/m.  Estimated Nutritional Needs:   Kcal:  1500-1700  Protein:  70-80 gm  Fluid:  >/= 1.5 L    Lucas Mallow RD, LDN, CNSC Please refer to Amion for contact information.

## 2021-05-22 NOTE — Progress Notes (Signed)
PROGRESS NOTE    Sarah Rojas  IRC:[id number] DOB: Apr 18, 1940 DOA: 05/20/2021 PCP: Brand Males, MD   Brief Narrative:  HPI per Dr. Karmen Bongo  Sarah Rojas is a 81 y.o. female with medical history significant of COPD; remote uterine cancer; and HLD presenting with a fall.   She reports that she had family visiting her last Sunday (5/14) for Mother's Day.  After they left, she was walking back into the house and she stumbled over a step, landing on her left hip.  She has had persistent pain and difficulty with ambulation since but thought it was just a bruise.  No other injuries.  She denies memory issues, although there is report of concern about this in the chart.    **Interim History Admitted for acute left hip fracture following a reported mechanical fall earlier in the week and orthopedic surgery was consulted and took the patient for a right hip arthroplasty from anterior approach and she is postoperative day 1.  PT OT evaluated and recommending home health.  Orthopedic surgery recommending weightbearing as tolerated with a walker and aspirin 81 p.o. twice daily for DVT prophylaxis  Assessment and Plan:  Hip fracture status post THA from a anterior approach postoperative day 1 -Apparently mechanical fall resulting in hip fracture -Orthopedics consulted commended weightbearing as tolerated with walker -SCDs overnight, start Lovenox post-operatively (or as per ortho) -Pain control with Tylenol, Robaxin, Oxycodone, and Morphine prn -TOC team consult for rehab placement -Will need PT consult post-operatively and they are recommending home health -Hip fracture order set utilized -TXA per orthopedics   Pre-operative stratification -Orthopedic/spinal surgery is associated with an intermediate (1-5%) cardiovascular risk for cardiac death and nonfatal MI -She has no diagnosed CVD RF (no known h/o MI or CHF) so her revised cardiac index gives a risk estimate of 3.9%, class  I risk -It is reasonable for her to go to the OR without additional evaluation   COPD -Not on home O2 -Uses albuterol inhaler prn -Possible nodule noted on CXR; needs CT non-emergently inpatient vs. Outpatient -Of note, during her last hospitalization in 05/2019 she was recommended for repeat chest CT in 11/2019; her most recent CT was in 03/2020 with evidence of COPD but no concerning findings  Leukocytosis -WBC went from 9.7 and is now 10.6 in the setting of surgical intervention -Continue monitor for signs and symptoms infection; no overt infection noted -Repeat CBC in a.m.  Acute blood loss anemia in the setting of postoperative drop -Patient's hemoglobin/hematocrit went from 12.3/38.1 is now 9.8/31.1 -Check anemia panel in a.m. -Continue monitor for signs and symptoms of bleeding; no overt bleeding noted -Repeat CBC in a.m.   HLD -Continue Crestor -She does have evidence of CAD and aortic atherosclerosis on prior CTs and may benefit from cardiology f/u as an outpatient   Hypotension -She was previously prescribed Midodrine but has run out of this medication -Her BPs have been marginal in the ER -Will resume midodrine -Continue monitor blood pressures per protocol -Blood pressure reading is now 109/60   Anxiety -Continue Valium TID prn   Stage 3 CKDb -Uncertain current baseline, as last recorded value was in 2022 and it was (barely) stage 3a CKD -Currently stage 3b -Patient's BUNs/creatinine now 1.27/1.36 is now 22/1.40 -Suspect that this is her baseline -Avoid further nephrotoxic medications, contrast dyes, hypotension and dehydration to ensure adequate renal perfusion and renally adjust medications -Repeat CMP in a.m.   Tobacco dependence -Encourage cessation.   -This was discussed  with the patient and should be reviewed on an ongoing basis.   -Patch ordered   Severe malnutrition in the context of chronic illness -Evidenced by severe muscle depletion and severe fat  depletion -Nutritionist consulted and recommending Ensure Enlive p.o. 3 times daily and multivitamin with minerals and  DVT prophylaxis: SCDs Start: 05/21/21 1627 SCDs Start: 05/21/21 1430 Place TED hose Start: 05/21/21 1430    Code Status: Full Code Family Communication: No family at bedside  Disposition Plan:  Level of care: Telemetry Surgical Status is: Inpatient Remains inpatient appropriate because: We will need orthopedic surgery clearance and need to ensure that her blood count is stable   Consultants:  Orthopedic surgery  Procedures:  PROCEDURE:  Left displaced femoral neck fracture, total hip replacement using DePuy ACTIS stem size 5 with standard neck, +1 stainless steel articular head 32 mm and Gription acetabular cup measuring 48 mm with standard liner and one cancellous bone screw 25 mm.  Antimicrobials:  Anti-infectives (From admission, onward)    Start     Dose/Rate Route Frequency Ordered Stop   05/21/21 1400  ceFAZolin (ANCEF) IVPB 2g/100 mL premix        2 g 200 mL/hr over 30 Minutes Intravenous Every 8 hours 05/21/21 1429 05/22/21 0559   05/21/21 0941  vancomycin (VANCOCIN) powder  Status:  Discontinued          As needed 05/21/21 0941 05/21/21 1031   05/21/21 0600  ceFAZolin (ANCEF) IVPB 2g/100 mL premix        2 g 200 mL/hr over 30 Minutes Intravenous On call to O.R. 05/21/21 0545 05/21/21 0820       Subjective: Seen and examined at bedside and she is doing okay. Supplemental oxygen as mild.  No nausea or vomiting.  Denied chest pain or shortness of breath.  Currently other concerns or complaints Mental status states that she has some pain but got some pain medication  Objective: Vitals:   05/21/21 2258 05/22/21 0430 05/22/21 0900 05/22/21 1159  BP: (!) 111/48  109/62   Pulse: 61 62 65   Resp: '18 14 17   '$ Temp: 98 F (36.7 C) 98.4 F (36.9 C) 98.3 F (36.8 C)   TempSrc: Oral Oral Oral   SpO2: 100% 96% 100% 95%  Weight:      Height:         Intake/Output Summary (Last 24 hours) at 05/22/2021 1840 Last data filed at 05/22/2021 0900 Gross per 24 hour  Intake 460 ml  Output 100 ml  Net 360 ml   Filed Weights   05/20/21 1546 05/21/21 0709  Weight: 44.5 kg 44.5 kg   Examination: Physical Exam:  Constitutional: Thin elderly Caucasian female currently no acute distress Respiratory: Diminished to auscultation bilaterally, no wheezing, rales, rhonchi or crackles. Normal respiratory effort and patient is not tachypenic. No accessory muscle use.  Wearing supplemental oxygen via nasal cannula Cardiovascular: RRR, no murmurs / rubs / gallops. S1 and S2 auscultated. No extremity edema.  Abdomen: Soft, non-tender, non-distended. Bowel sounds positive.  GU: Deferred. Musculoskeletal: No clubbing / cyanosis of digits/nails. No joint deformity upper and lower extremities.  Neurologic: CN 2-12 grossly intact with no focal deficits. Romberg sign and cerebellar reflexes not assessed.  Psychiatric: Normal judgment and insight. Alert and oriented x 3. Normal mood and appropriate affect.   Data Reviewed: I have personally reviewed following labs and imaging studies  CBC: Recent Labs  Lab 05/20/21 1852 05/22/21 0254  WBC 9.7 10.6*  NEUTROABS 7.1  --  HGB 12.3 9.8*  HCT 38.1 31.1*  MCV 95.7 101.0*  PLT 209 585   Basic Metabolic Panel: Recent Labs  Lab 05/20/21 1852 05/22/21 0254  NA 136 135  K 3.6 4.9  CL 103 106  CO2 23 26  GLUCOSE 84 203*  BUN 27* 22  CREATININE 1.36* 1.40*  CALCIUM 8.7* 8.0*   GFR: Estimated Creatinine Clearance: 22.5 mL/min (A) (by C-G formula based on SCr of 1.4 mg/dL (H)). Liver Function Tests: Recent Labs  Lab 05/20/21 1852  AST 32  ALT 16  ALKPHOS 61  BILITOT 0.3  PROT 6.8  ALBUMIN 3.8   No results for input(s): LIPASE, AMYLASE in the last 168 hours. No results for input(s): AMMONIA in the last 168 hours. Coagulation Profile: Recent Labs  Lab 05/21/21 1514  INR 1.0   Cardiac  Enzymes: No results for input(s): CKTOTAL, CKMB, CKMBINDEX, TROPONINI in the last 168 hours. BNP (last 3 results) No results for input(s): PROBNP in the last 8760 hours. HbA1C: No results for input(s): HGBA1C in the last 72 hours. CBG: No results for input(s): GLUCAP in the last 168 hours. Lipid Profile: No results for input(s): CHOL, HDL, LDLCALC, TRIG, CHOLHDL, LDLDIRECT in the last 72 hours. Thyroid Function Tests: No results for input(s): TSH, T4TOTAL, FREET4, T3FREE, THYROIDAB in the last 72 hours. Anemia Panel: No results for input(s): VITAMINB12, FOLATE, FERRITIN, TIBC, IRON, RETICCTPCT in the last 72 hours. Sepsis Labs: No results for input(s): PROCALCITON, LATICACIDVEN in the last 168 hours.  Recent Results (from the past 240 hour(s))  Surgical pcr screen     Status: Abnormal   Collection Time: 05/21/21  6:57 AM   Specimen: Nasal Mucosa; Nasal Swab  Result Value Ref Range Status   MRSA, PCR NEGATIVE NEGATIVE Final   Staphylococcus aureus POSITIVE (A) NEGATIVE Final    Comment: (NOTE) The Xpert SA Assay (FDA approved for NASAL specimens in patients 26 years of age and older), is one component of a comprehensive surveillance program. It is not intended to diagnose infection nor to guide or monitor treatment. Performed at [facility], [place] [address], [place], [place] [address]     Radiology Studies: CT Head Wo Contrast  Result Date: 05/20/2021 CLINICAL DATA:  Head trauma, moderate-severe EXAM: CT HEAD WITHOUT CONTRAST TECHNIQUE: Contiguous axial images were obtained from the base of the skull through the vertex without intravenous contrast. RADIATION DOSE REDUCTION: This exam was performed according to the departmental dose-optimization program which includes automated exposure control, adjustment of the mA and/or kV according to patient size and/or use of iterative reconstruction technique. COMPARISON:  05/18/2019 FINDINGS: Brain: No evidence of acute infarction,  hemorrhage, hydrocephalus, extra-axial collection or mass lesion/mass effect. Scattered low-density changes within the periventricular and subcortical white matter compatible with chronic microvascular ischemic change. Mild diffuse cerebral volume loss. Vascular: Atherosclerotic calcifications involving the large vessels of the skull base. No unexpected hyperdense vessel. Skull: Normal. Negative for fracture or focal lesion. Sinuses/Orbits: No acute finding. Other: None. IMPRESSION: 1. No acute intracranial abnormality. 2. Chronic microvascular ischemic change and cerebral volume loss. Electronically Signed   By: Davina Poke D.O.   On: 05/20/2021 18:53   Pelvis Portable  Result Date: 05/21/2021 CLINICAL DATA:  Postop exam after left hip replacement EXAM: PORTABLE PELVIS 1-2 VIEWS COMPARISON:  None Available. FINDINGS: The patient is status post left hip replacement. Acetabular and femoral components are in good position. Postoperative air seen in the soft tissues. IMPRESSION: Left hip replacement as above. Hardware  is in good position. Postoperative air seen in the soft tissues. Electronically Signed   By: Dorise Bullion III M.D.   On: 05/21/2021 12:57   DG Chest Port 1 View  Result Date: 05/20/2021 CLINICAL DATA:  Fall. EXAM: PORTABLE CHEST 1 VIEW COMPARISON:  Chest x-ray 05/19/2019.  Chest CT 03/17/2020. FINDINGS: Linear scarring is again seen bilaterally. There is a questionable small focal opacity in the right upper lobe which was not seen on the prior study. This is slightly nodular. There is no lung consolidation, pleural effusion or pneumothorax. Cardiomediastinal silhouette is within normal limits. No fractures are seen. IMPRESSION: 1. Questionable small/nodular density in the right upper lobe. Recommend follow-up nonemergent chest CT. 2. Stable areas of bilateral scarring. Electronically Signed   By: Ronney Asters M.D.   On: 05/20/2021 18:53   DG C-Arm 1-60 Min-No Report  Result Date:  05/21/2021 Fluoroscopy was utilized by the requesting physician.  No radiographic interpretation.   DG C-Arm 1-60 Min-No Report  Result Date: 05/21/2021 Fluoroscopy was utilized by the requesting physician.  No radiographic interpretation.   DG HIP UNILAT WITH PELVIS 2-3 VIEWS LEFT  Result Date: 05/21/2021 CLINICAL DATA:  S/p LEFT hip arthroplasty. EXAM: DG HIP (WITH OR WITHOUT PELVIS) 3V LEFT COMPARISON:  05/20/2021 FINDINGS: Intraoperative spot films of the LEFT hip are submitted postoperatively for interpretation. LEFT total hip arthroplasty identified without complicating features. IMPRESSION: LEFT total hip arthroplasty without complicating features. Electronically Signed   By: Margarette Canada M.D.   On: 05/21/2021 10:20     Scheduled Meds:  aspirin  81 mg Oral BID   chlorhexidine  60 mL Topical Once   Chlorhexidine Gluconate Cloth  6 each Topical Daily   docusate sodium  100 mg Oral BID   docusate sodium  100 mg Oral BID   feeding supplement  237 mL Oral TID BM   midodrine  5 mg Oral TID WC   multivitamin with minerals  1 tablet Oral Daily   mupirocin ointment  1 application. Nasal BID   rosuvastatin  5 mg Oral QHS   Continuous Infusions:  lactated ringers     methocarbamol (ROBAXIN) IV     methocarbamol (ROBAXIN) IV      LOS: 1 day   Raiford Noble, DO [facility] Available via Epic secure chat 7am-7pm After these hours, please refer to coverage provider listed on [email or web address] 05/22/2021, 6:40 PM

## 2021-05-22 NOTE — Evaluation (Signed)
Physical Therapy Evaluation Patient Details Name: Sarah Rojas MRN: [id number] DOB: [birth year] Today's Date: 05/22/2021  History of Present Illness  Sarah Rojas is a 81 y.o. female with medical history significant of COPD; remote uterine cancer; and HLD presenting with a fall.   She reports that she had family visiting her last Sunday (5/14) for Mother's Day.  After they left, she was walking back into the house and she stumbled over a step, landing on her left hip.  She has had persistent pain and difficulty with ambulation since but thought it was just a bruise.  No other injuries.  She denies memory issues, although there is report of concern about this in the chart.   Clinical Impression  Patient received in bed, she is agreeable to PT session. She is mod independent with bed mobility. Transfers with min guard. Ambulated 30 feet in room with RW and cues for safe use of AD, sequencing.  Patient reporting 9/10 pain with ambulation. (Patient did not want more than tylenol prior to session, now requesting more) She will continue to benefit from skilled PT while here to improve safety, and functional independence.        Recommendations for follow up therapy are one component of a multi-disciplinary discharge planning process, led by the attending physician.  Recommendations may be updated based on patient status, additional functional criteria and insurance authorization.  Follow Up Recommendations Home health PT    Assistance Recommended at Discharge Frequent or constant Supervision/Assistance  Patient can return home with the following  A little help with walking and/or transfers;A little help with bathing/dressing/bathroom;Assistance with cooking/housework;Assist for transportation;Help with stairs or ramp for entrance;Direct supervision/assist for medications management    Equipment Recommendations Rolling walker (2 wheels)  Recommendations for Other Services       Functional  Status Assessment Patient has had a recent decline in their functional status and demonstrates the ability to make significant improvements in function in a reasonable and predictable amount of time.     Precautions / Restrictions Precautions Precautions: Fall Restrictions Weight Bearing Restrictions: Yes LLE Weight Bearing: Weight bearing as tolerated      Mobility  Bed Mobility Overal bed mobility: Modified Independent             General bed mobility comments: increased time and effort, bo assist needed    Transfers Overall transfer level: Needs assistance Equipment used: Rolling walker (2 wheels) Transfers: Sit to/from Stand Sit to Stand: Min guard                Ambulation/Gait Ambulation/Gait assistance: Min guard Gait Distance (Feet): 30 Feet Assistive device: Rolling walker (2 wheels) Gait Pattern/deviations: Decreased step length - right, Decreased step length - left, Decreased stride length, Trunk flexed Gait velocity: decr     General Gait Details: cues needed for safe use of RW, tends to stand too far back.  Stairs            Wheelchair Mobility    Modified Rankin (Stroke Patients Only)       Balance Overall balance assessment: Needs assistance Sitting-balance support: Feet supported Sitting balance-Leahy Scale: Good     Standing balance support: Bilateral upper extremity supported, During functional activity, Reliant on assistive device for balance Standing balance-Leahy Scale: Fair Standing balance comment: reliant on B UE support and min guard for safety  Pertinent Vitals/Pain Pain Assessment Pain Assessment: 0-10 Pain Score: 9  Pain Descriptors / Indicators: Discomfort, Sore, Operative site guarding, Grimacing, Guarding Pain Intervention(s): Monitored during session, Patient requesting pain meds-RN notified, Repositioned    Home Living Family/patient expects to be discharged to::  Private residence Living Arrangements: Alone Available Help at Discharge: Family;Available PRN/intermittently Type of Home: House Home Access: Ramped entrance       Home Layout: One level Home Equipment: Standard Walker;Cane - single point      Prior Function Prior Level of Function : Independent/Modified Independent             Mobility Comments: patient uses cane or walker as needed occasionally. She does drive, but has not recently. ADLs Comments: Independent at baseline     Hand Dominance        Extremity/Trunk Assessment   Upper Extremity Assessment Upper Extremity Assessment: Overall WFL for tasks assessed    Lower Extremity Assessment Lower Extremity Assessment: LLE deficits/detail LLE Deficits / Details: post surgical    Cervical / Trunk Assessment Cervical / Trunk Assessment: Normal  Communication   Communication: No difficulties  Cognition Arousal/Alertness: Awake/alert Behavior During Therapy: WFL for tasks assessed/performed Overall Cognitive Status: Within Functional Limits for tasks assessed                                          General Comments      Exercises     Assessment/Plan    PT Assessment Patient needs continued PT services  PT Problem List Decreased strength;Decreased mobility;Decreased range of motion;Decreased balance;Pain;Decreased knowledge of use of DME;Decreased activity tolerance;Decreased safety awareness       PT Treatment Interventions DME instruction;Therapeutic exercise;Gait training;Balance training;Functional mobility training;Therapeutic activities;Patient/family education    PT Goals (Current goals can be found in the Care Plan section)  Acute Rehab PT Goals Patient Stated Goal: to go home. Patient adamant that she is not going to nursing home. States she has family and neighbors who can help her PT Goal Formulation: With patient Time For Goal Achievement: 05/31/21 Potential to Achieve  Goals: Fair    Frequency Min 3X/week     Co-evaluation               AM-PAC PT "6 Clicks" Mobility  Outcome Measure Help needed turning from your back to your side while in a flat bed without using bedrails?: A Little Help needed moving from lying on your back to sitting on the side of a flat bed without using bedrails?: A Little Help needed moving to and from a bed to a chair (including a wheelchair)?: A Little Help needed standing up from a chair using your arms (e.g., wheelchair or bedside chair)?: A Little Help needed to walk in hospital room?: A Little Help needed climbing 3-5 steps with a railing? : A Lot 6 Click Score: 17    End of Session Equipment Utilized During Treatment: Gait belt Activity Tolerance: Patient limited by pain;Patient limited by fatigue Patient left: with call bell/phone within reach;in chair;with chair alarm set Nurse Communication: Mobility status PT Visit Diagnosis: Muscle weakness (generalized) (M62.81);Difficulty in walking, not elsewhere classified (R26.2);Unsteadiness on feet (R26.81);Pain;History of falling (Z91.81) Pain - Right/Left: Left Pain - part of body: Leg    Time: 1130-1154 PT Time Calculation (min) (ACUTE ONLY): 24 min   Charges:   PT Evaluation $PT Eval Moderate Complexity: 1 Mod PT  Treatments $Gait Training: 8-22 mins        [employer], PT, [employer] 05/22/21,12:08 PM

## 2021-05-22 NOTE — Progress Notes (Signed)
  Subjective: Sarah Rojas is a 81 y.o. female s/p left THA.  They are POD1.  Pt's pain is controlled but moderate; she has been avoiding taking opiod meds as much as she can.  Pt has ambulated with some difficulty.  Was able to ambulate 30 feet in PT earlier. No dizziness. Denies chest pain, SOB, abd pain or significant calf pain.  Eating spaghetti in chair upon exam.  Objective: Vital signs in last 24 hours: Temp:  [97.6 F (36.4 C)-98.4 F (36.9 C)] 98.3 F (36.8 C) (05/22 0900) Pulse Rate:  [61-92] 65 (05/22 0900) Resp:  [14-20] 17 (05/22 0900) BP: (108-120)/(48-62) 109/62 (05/22 0900) SpO2:  [95 %-100 %] 95 % (05/22 1159)  Intake/Output from previous day: 05/21 0701 - 05/22 0700 In: 1400 [I.V.:1300; IV Piggyback:100] Out: 251 [Urine:151; Blood:100] Intake/Output this shift: Total I/O In: 360 [P.O.:360] Out: -   Exam:  No gross blood or drainage overlying the dressing 2+ DP pulse Sensation intact distally in the left foot Able to dorsiflex and plantarflex the left foot No calf TTP, neg homans   Labs: Recent Labs    05/20/21 1852 05/22/21 0254  HGB 12.3 9.8*   Recent Labs    05/20/21 1852 05/22/21 0254  WBC 9.7 10.6*  RBC 3.98 3.08*  HCT 38.1 31.1*  PLT 209 195   Recent Labs    05/20/21 1852 05/22/21 0254  NA 136 135  K 3.6 4.9  CL 103 106  CO2 23 26  BUN 27* 22  CREATININE 1.36* 1.40*  GLUCOSE 84 203*  CALCIUM 8.7* 8.0*   Recent Labs    05/21/21 1514  INR 1.0    Assessment/Plan: Pt is POD1 s/p left THA.    -Disposition pending medical team clearance. Patient and family prefer discharge home; she lives next to one of her children and has a lot of family in the area that would be able to help her  -WBAT with a walker  -Aspirin '81mg'$  BID for DVT prophylaxis   Donella Stade 05/22/2021, 12:53 PM

## 2021-05-23 DIAGNOSIS — E785 Hyperlipidemia, unspecified: Secondary | ICD-10-CM | POA: Diagnosis not present

## 2021-05-23 DIAGNOSIS — J449 Chronic obstructive pulmonary disease, unspecified: Secondary | ICD-10-CM | POA: Diagnosis not present

## 2021-05-23 DIAGNOSIS — N1832 Chronic kidney disease, stage 3b: Secondary | ICD-10-CM | POA: Diagnosis not present

## 2021-05-23 DIAGNOSIS — S72002A Fracture of unspecified part of neck of left femur, initial encounter for closed fracture: Secondary | ICD-10-CM | POA: Diagnosis not present

## 2021-05-23 LAB — COMPREHENSIVE METABOLIC PANEL
ALT: 9 U/L (ref 0–44)
AST: 26 U/L (ref 15–41)
Albumin: 2.3 g/dL — ABNORMAL LOW (ref 3.5–5.0)
Alkaline Phosphatase: 57 U/L (ref 38–126)
Anion gap: 4 — ABNORMAL LOW (ref 5–15)
BUN: 24 mg/dL — ABNORMAL HIGH (ref 8–23)
CO2: 23 mmol/L (ref 22–32)
Calcium: 7.9 mg/dL — ABNORMAL LOW (ref 8.9–10.3)
Chloride: 107 mmol/L (ref 98–111)
Creatinine, Ser: 1.7 mg/dL — ABNORMAL HIGH (ref 0.44–1.00)
GFR, Estimated: 30 mL/min — ABNORMAL LOW (ref >60)
Glucose, Bld: 100 mg/dL — ABNORMAL HIGH (ref 70–99)
Potassium: 4.3 mmol/L (ref 3.5–5.1)
Sodium: 134 mmol/L — ABNORMAL LOW (ref 135–145)
Total Bilirubin: 0.2 mg/dL — ABNORMAL LOW (ref 0.3–1.2)
Total Protein: 4.7 g/dL — ABNORMAL LOW (ref 6.5–8.1)

## 2021-05-23 LAB — CBC WITH DIFFERENTIAL/PLATELET
Abs Immature Granulocytes: 0.02 10*3/uL (ref 0.00–0.07)
Basophils Absolute: 0 10*3/uL (ref 0.0–0.1)
Basophils Relative: 0 %
Eosinophils Absolute: 0.2 10*3/uL (ref 0.0–0.5)
Eosinophils Relative: 2 %
HCT: 25.8 % — ABNORMAL LOW (ref 36.0–46.0)
Hemoglobin: 8.6 g/dL — ABNORMAL LOW (ref 12.0–15.0)
Immature Granulocytes: 0 %
Lymphocytes Relative: 12 %
Lymphs Abs: 1.1 10*3/uL (ref 0.7–4.0)
MCH: 32 pg (ref 26.0–34.0)
MCHC: 33.3 g/dL (ref 30.0–36.0)
MCV: 95.9 fL (ref 80.0–100.0)
Monocytes Absolute: 0.9 10*3/uL (ref 0.1–1.0)
Monocytes Relative: 10 %
Neutro Abs: 7.1 10*3/uL (ref 1.7–7.7)
Neutrophils Relative %: 76 %
Platelets: 198 10*3/uL (ref 150–400)
RBC: 2.69 MIL/uL — ABNORMAL LOW (ref 3.87–5.11)
RDW: 13.6 % (ref 11.5–15.5)
WBC: 9.3 10*3/uL (ref 4.0–10.5)
nRBC: 0 % (ref 0.0–0.2)

## 2021-05-23 LAB — MAGNESIUM: Magnesium: 2.1 mg/dL (ref 1.7–2.4)

## 2021-05-23 LAB — PHOSPHORUS: Phosphorus: 2.3 mg/dL — ABNORMAL LOW (ref 2.5–4.6)

## 2021-05-23 MED ORDER — SENNOSIDES-DOCUSATE SODIUM 8.6-50 MG PO TABS
1.0000 | ORAL_TABLET | Freq: Two times a day (BID) | ORAL | Status: DC
  Administered 2021-05-23 – 2021-05-25 (×5): 1 via ORAL
  Filled 2021-05-23 (×5): qty 1

## 2021-05-23 MED ORDER — K PHOS MONO-SOD PHOS DI & MONO 155-852-130 MG PO TABS
500.0000 mg | ORAL_TABLET | Freq: Once | ORAL | Status: AC
Start: 2021-05-23 — End: 2021-05-23
  Administered 2021-05-23: 500 mg via ORAL
  Filled 2021-05-23: qty 2

## 2021-05-23 MED ORDER — SODIUM CHLORIDE 0.9 % IV SOLN: INTRAVENOUS | Status: DC

## 2021-05-23 MED ORDER — POLYETHYLENE GLYCOL 3350 17 G PO PACK
17.0000 g | PACK | Freq: Two times a day (BID) | ORAL | Status: DC
  Administered 2021-05-23 – 2021-05-25 (×5): 17 g via ORAL
  Filled 2021-05-23 (×5): qty 1

## 2021-05-23 NOTE — TOC CAGE-AID Note (Signed)
Transition of Care [facility]) - CAGE-AID Screening   Patient Details  Name: Sarah Rojas MRN: [id number] Date of Birth: 12-20-1940  [facility]) CM/SW Contact:    Nollan Muldrow C Tarpley-Carter, [place] Phone Number: 05/23/2021, 10:01 AM   Clinical Narrative: Pt participated in [place].  Pt stated she does use substance, but does not drink ETOH.  Pt was offered resources, due to no usage of substance.     [name], MSW, LCSW-A Pronouns:  She/Her/Hers [facility] Clinical Social Worker Direct Number:  [phone] [name]  CAGE-AID Screening:    Have You Ever Felt You Ought to Cut Down on Your Drinking or Drug Use?: Yes Have People Annoyed You By [employer] Your Drinking Or Drug Use?: No Have You Felt Bad Or Guilty About Your Drinking Or Drug Use?: No Have You Ever Had a Drink or Used Drugs First Thing In The Morning to Steady Your Nerves or to Get Rid of a Hangover?: No CAGE-AID Score: 1  Substance Abuse Education Offered: Yes  Substance abuse interventions: Scientist, clinical (histocompatibility and immunogenetics)

## 2021-05-23 NOTE — Progress Notes (Signed)
Physical Therapy Treatment Patient Details Name: Sarah Rojas MRN: [id number] DOB: 02/02/1940 Today's Date: 05/23/2021   History of Present Illness Pt is an 81 y.o. female who presented to the ED on 5/20 following a fall at home. She fell onto her L hip 5/14 but initially only suspected it was a deep bruise. She went to the ED several days later after persistent pain and difficulty ambulating. Xray revealed L hip fx. She underwent L THA 5/20. PMH: COPD; remote uterine cancer; and HLD    PT Comments    Pt in bed on arrival, agreeable to participation in therapy. She required min assist supine to sit, min guard assist transfers, and min guard assist ambulation 40' with RW. Pt assisted to/from bathroom and then to recliner. Pt in recliner with feet elevated at end of session.    Recommendations for follow up therapy are one component of a multi-disciplinary discharge planning process, led by the attending physician.  Recommendations may be updated based on patient status, additional functional criteria and insurance authorization.  Follow Up Recommendations  Home health PT     Assistance Recommended at Discharge Frequent or constant Supervision/Assistance  Patient can return home with the following A little help with walking and/or transfers;A little help with bathing/dressing/bathroom;Assistance with cooking/housework;Assist for transportation;Help with stairs or ramp for entrance;Direct supervision/assist for medications management   Equipment Recommendations  Rolling walker (2 wheels)    Recommendations for Other Services       Precautions / Restrictions Precautions Precautions: Fall Restrictions Weight Bearing Restrictions: Yes LLE Weight Bearing: Weight bearing as tolerated     Mobility  Bed Mobility Overal bed mobility: Needs Assistance Bed Mobility: Supine to Sit     Supine to sit: Min assist, HOB elevated     General bed mobility comments: +rail, increased time,  assist with LLE    Transfers Overall transfer level: Needs assistance Equipment used: Rolling walker (2 wheels) Transfers: Sit to/from Stand Sit to Stand: Min guard           General transfer comment: cues for hand placement and sequencing, increased time    Ambulation/Gait Ambulation/Gait assistance: Min guard Gait Distance (Feet): 40 Feet Assistive device: Rolling walker (2 wheels) Gait Pattern/deviations: Trunk flexed, Decreased weight shift to left, Decreased step length - right, Decreased step length - left, Decreased stride length Gait velocity: very slow Gait velocity interpretation: <1.31 ft/sec, indicative of household ambulator   General Gait Details: cues for proximity to RW as pt tends to stand too far back. Pt responding with "I am walking the way its most comfortable for me."   Stairs             Wheelchair Mobility    Modified Rankin (Stroke Patients Only)       Balance Overall balance assessment: Needs assistance Sitting-balance support: Feet supported, No upper extremity supported Sitting balance-Leahy Scale: Good     Standing balance support: Bilateral upper extremity supported, During functional activity, Reliant on assistive device for balance Standing balance-Leahy Scale: Poor                              Cognition Arousal/Alertness: Awake/alert Behavior During Therapy: WFL for tasks assessed/performed Overall Cognitive Status: Within Functional Limits for tasks assessed  Exercises      General Comments        Pertinent Vitals/Pain Pain Assessment Pain Assessment: Faces Faces Pain Scale: Hurts even more Pain Location: L hip Pain Descriptors / Indicators: Discomfort, Sore, Operative site guarding, Grimacing Pain Intervention(s): Monitored during session, Repositioned, Limited activity within patient's tolerance    Home Living                           Prior Function            PT Goals (current goals can now be found in the care plan section) Acute Rehab PT Goals Patient Stated Goal: home Progress towards PT goals: Progressing toward goals    Frequency    Min 3X/week      PT Plan Current plan remains appropriate    Co-evaluation              AM-PAC PT "6 Clicks" Mobility   Outcome Measure  Help needed turning from your back to your side while in a flat bed without using bedrails?: A Little Help needed moving from lying on your back to sitting on the side of a flat bed without using bedrails?: A Little Help needed moving to and from a bed to a chair (including a wheelchair)?: A Little Help needed standing up from a chair using your arms (e.g., wheelchair or bedside chair)?: A Little Help needed to walk in hospital room?: A Little Help needed climbing 3-5 steps with a railing? : A Lot 6 Click Score: 17    End of Session Equipment Utilized During Treatment: Gait belt Activity Tolerance: Patient limited by pain;Patient limited by fatigue Patient left: in chair;with call bell/phone within reach;with chair alarm set Nurse Communication: Mobility status PT Visit Diagnosis: Muscle weakness (generalized) (M62.81);Difficulty in walking, not elsewhere classified (R26.2);Unsteadiness on feet (R26.81);Pain;History of falling (Z91.81) Pain - Right/Left: Left Pain - part of body: Leg     Time: 5009-3818 PT Time Calculation (min) (ACUTE ONLY): 27 min  Charges:  $Gait Training: 23-37 mins                     Lorrin Goodell, [place]  Office # [phone] Pager [phone]    Lorriane Shire 05/23/2021, 11:10 AM

## 2021-05-23 NOTE — TOC Initial Note (Addendum)
Transition of Care [facility]) - Initial/Assessment Note    Patient Details  Name: Sarah Rojas MRN: [id number] Date of Birth: 10/07/1940  Transition of Care [facility]) CM/SW Contact:    Joanne Chars, LCSW Phone Number: 05/23/2021, 11:56 AM  Clinical Narrative:     CSW spoke with pt regarding DC recommendation for [facility].  Pt reports she lives alone, no current services.  Permission given to speak with son Truman Hayward.  Pt is agreeable to [facility], choice document given.  Discussed that son had spoken to RN about admission to SNF. Pt does not want to go to SNF, SNF not recommended.  CSW will speak with son about this as well.  Current DME in home: bedside commode and transport chair.  Rolling walker is recommended and pt would like to receive this.    CSW spoke with son Truman Hayward on the phone, he was finding parking spot on his way up to the room, CSW will speak with him in the room.              1330: CSW spoke with son Truman Hayward, he has concerns regarding pt being discharged home and is requesting that CSW make efforts to place pt in SNF.  Pt agrees to this but does not want to go to [facility].  Discussed that pt will need insurance approval and discussed whether family wants to self pay if insurance denies, not sure at this time.  Referral sent out in hub for SNF.  Insurance auth initiated with [employer].  Expected Discharge Plan: [place] Barriers to Discharge: Continued Medical Work up, Other (must enter comment) ([place] recommendation with [employer] insurance)   Patient Goals and CMS Choice Patient states their goals for this hospitalization and ongoing recovery are:: get home CMS Medicare.gov Compare Post Acute Care list provided to:: Patient Choice offered to / list presented to : Patient  Expected Discharge Plan and Services Expected Discharge Plan: [place] In-house Referral: Clinical Social Work   Post Acute Care Choice: [place] arrangements for the past 2 months:  [place]                                      Prior Living Arrangements/Services Living arrangements for the past 2 months: Single Family Home Lives with:: Self Patient language and need for interpreter reviewed:: Yes Do you feel safe going back to the place where you live?: Yes      Need for Family Participation in Patient Care: No (Comment) Care giver support system in place?: Yes (comment) Current home services: Other (comment) (none) Criminal Activity/Legal Involvement Pertinent to Current Situation/Hospitalization: No - Comment as needed  Activities of Daily Living Home Assistive Devices/Equipment: Walker (specify type) ADL Screening (condition at time of admission) Patient's cognitive ability adequate to safely complete daily activities?: Yes Is the patient deaf or have difficulty hearing?: No Does the patient have difficulty seeing, even when wearing glasses/contacts?: No Does the patient have difficulty concentrating, remembering, or making decisions?: No Patient able to express need for assistance with ADLs?: No Does the patient have difficulty dressing or bathing?: Yes Independently performs ADLs?: Yes (appropriate for developmental age) Does the patient have difficulty walking or climbing stairs?: Yes Weakness of Legs: Both Weakness of Arms/Hands: Both  Permission Sought/Granted Permission sought to share information with : Family Supports Permission granted to share information with :  Yes, Verbal Permission Granted  Share Information with NAME: son Truman Hayward  Permission granted to share info w AGENCY: HH        Emotional Assessment Appearance:: Appears stated age Attitude/Demeanor/Rapport: Engaged Affect (typically observed): Appropriate, Pleasant Orientation: : Oriented to Self, Oriented to Place, Oriented to  Time, Oriented to Situation Alcohol / Substance Use: Not Applicable Psych Involvement: Outpatient Provider  Admission diagnosis:  Displaced  fracture of left femoral neck (HCC) [S72.002A] Closed fracture of left hip, initial encounter ([place]) [S72.002A] Injury of head, initial encounter [S09.90XA] Fall, initial encounter [W19.XXXA] Patient Active Problem List   Diagnosis Date Noted   Protein-calorie malnutrition, severe 05/22/2021   COPD not affecting current episode of care ([place]) 05/21/2021   Dyslipidemia 05/21/2021   Hypotension, chronic 05/21/2021   Anxiety disorder 05/21/2021   Chronic kidney disease, stage 3b ([place]) 05/21/2021   Displaced fracture of left femoral neck ([place]) 05/20/2021   Hypoglycemia 05/18/2019   Tobacco abuse 05/18/2019   PCP:  Brand Males, MD Pharmacy:   [id number] [place] [id number]: [phone]: [phone]    Social Determinants of Health (SDOH) Interventions    Readmission Risk Interventions     View : No data to display.

## 2021-05-23 NOTE — NC FL2 (Signed)
[place] LEVEL OF CARE SCREENING TOOL     IDENTIFICATION  Patient Name: Sarah Rojas Birthdate: 07/25/1940 Sex: female Admission Date (Current Location): 05/20/2021  [facility] and [place] Number:  Herbalist and Address:  [facility] [place], [place] [address] [place],  [address]      Provider Number: [id number]  Attending Physician Name and Address:  Kerney Elbe, DO  Relative Name and Phone Number:  Sullivan, Blasing   [phone]    Current Level of Care: Hospital Recommended Level of Care: [place] Prior Approval Number:    Date Approved/Denied:   PASRR Number: [id number]  Discharge Plan: SNF    Current Diagnoses: Patient Active Problem List   Diagnosis Date Noted   Protein-calorie malnutrition, severe 05/22/2021   COPD not affecting current episode of care [facility]) 05/21/2021   Dyslipidemia 05/21/2021   Hypotension, chronic 05/21/2021   Anxiety disorder 05/21/2021   Chronic kidney disease, stage 3b ([place]) 05/21/2021   Displaced fracture of left femoral neck ([place]) 05/20/2021   Hypoglycemia 05/18/2019   Tobacco abuse 05/18/2019    Orientation RESPIRATION BLADDER Height & Weight     Self, Time, Situation, Place  O2 Continent, External catheter Weight: 98 lb 1.7 oz (44.5 kg) Height:  5' 0.98" (154.9 cm)  BEHAVIORAL SYMPTOMS/MOOD NEUROLOGICAL BOWEL NUTRITION STATUS      Continent Diet (see discharge summary)  AMBULATORY STATUS COMMUNICATION OF NEEDS Skin   Supervision Verbally Surgical wounds                       Personal Care Assistance Level of Assistance  Bathing, Feeding, Dressing Bathing Assistance: Limited assistance Feeding assistance: Independent Dressing Assistance: Limited assistance     Functional Limitations Info  Sight, Hearing, Speech Sight Info: Adequate Hearing Info: Adequate Speech Info: Adequate    SPECIAL CARE FACTORS FREQUENCY  PT (By licensed PT), OT (By  licensed OT)     PT Frequency: 5x week OT Frequency: 5x week            Contractures Contractures Info: Not present    Additional Factors Info  Code Status, Allergies Code Status Info: full Allergies Info: latex           Current Medications (05/23/2021):  This is the current hospital active medication list Current Facility-Administered Medications  Medication Dose Route Frequency Provider Last Rate Last Admin   0.9 %  sodium chloride infusion   Intravenous Continuous Raiford Noble Benjamin, DO 75 mL/hr at 05/23/21 1228 New Bag at 05/23/21 1228   acetaminophen (TYLENOL) tablet 650 mg  650 mg Oral Q6H PRN Howerter, Justin B, DO       Or   acetaminophen (TYLENOL) suppository 650 mg  650 mg Rectal Q6H PRN Howerter, Justin B, DO       acetaminophen (TYLENOL) tablet 325-650 mg  325-650 mg Oral Q6H PRN Magnant, Charles L, PA-C       albuterol (PROVENTIL) (2.5 MG/3ML) 0.083% nebulizer solution 2.5 mg  2.5 mg Nebulization Q4H PRN Karmen Bongo, MD       aspirin chewable tablet 81 mg  81 mg Oral BID Magnant, Charles L, PA-C   81 mg at 05/23/21 0743   bisacodyl (DULCOLAX) EC tablet 5 mg  5 mg Oral Daily PRN Karmen Bongo, MD       chlorhexidine (HIBICLENS) 4 % liquid 4 application.  60 mL Topical Once Karmen Bongo, MD       Chlorhexidine  Gluconate Cloth 2 % PADS 6 each  6 each Topical Daily Raiford Noble [place], [place]   6 each at 05/23/21 1231   diazepam (VALIUM) tablet 5 mg  5 mg Oral TID PRN Karmen Bongo, MD   5 mg at 05/23/21 1126   feeding supplement (ENSURE ENLIVE / ENSURE PLUS) liquid 237 mL  237 mL Oral TID BM Sheikh, Omair Latif, DO   237 mL at 05/23/21 1044   fentaNYL (SUBLIMAZE) injection 25 mcg  25 mcg Intravenous Q2H PRN Howerter, Justin B, DO       HYDROcodone-acetaminophen (NORCO/VICODIN) 5-325 MG per tablet 1-2 tablet  1-2 tablet Oral Q4H PRN Magnant, Charles L, PA-C   1 tablet at 05/23/21 0175   lactated ringers infusion   Intravenous Continuous Karmen Bongo, MD        menthol-cetylpyridinium (CEPACOL) lozenge 3 mg  1 lozenge Oral PRN Magnant, Charles L, PA-C       Or   phenol (CHLORASEPTIC) mouth spray 1 spray  1 spray Mouth/Throat PRN Magnant, Charles L, PA-C       methocarbamol (ROBAXIN) tablet 500 mg  500 mg Oral Q6H PRN Karmen Bongo, MD       Or   methocarbamol (ROBAXIN) 500 mg in dextrose 5 % 50 mL IVPB  500 mg Intravenous Q6H PRN Karmen Bongo, MD       methocarbamol (ROBAXIN) tablet 500 mg  500 mg Oral Q6H PRN Magnant, Charles L, PA-C   500 mg at 05/23/21 1025   Or   methocarbamol (ROBAXIN) 500 mg in dextrose 5 % 50 mL IVPB  500 mg Intravenous Q6H PRN Magnant, Charles L, PA-C       metoCLOPramide (REGLAN) tablet 5-10 mg  5-10 mg Oral Q8H PRN Magnant, Charles L, PA-C       Or   metoCLOPramide (REGLAN) injection 5-10 mg  5-10 mg Intravenous Q8H PRN Magnant, Charles L, PA-C       midodrine (PROAMATINE) tablet 5 mg  5 mg Oral TID WC Karmen Bongo, MD   5 mg at 05/23/21 1221   morphine (PF) 2 MG/ML injection 0.5 mg  0.5 mg Intravenous Q2H PRN Magnant, Charles L, PA-C       morphine (PF) 2 MG/ML injection 2 mg  2 mg Intravenous Q2H PRN Karmen Bongo, MD       multivitamin with minerals tablet 1 tablet  1 tablet Oral Daily Raiford Noble [place], DO   1 tablet at 05/23/21 1020   mupirocin ointment (BACTROBAN) 2 % 1 application.  1 application. Nasal BID Raiford Noble [place], DO   1 application. at 05/23/21 1021   naloxone (NARCAN) injection 0.4 mg  0.4 mg Intravenous PRN Howerter, Justin B, DO       nicotine (NICODERM CQ - dosed in mg/24 hours) patch 14 mg  14 mg Transdermal Daily PRN Karmen Bongo, MD       ondansetron [facility]) injection 4 mg  4 mg Intravenous Q6H PRN Howerter, Justin B, DO       ondansetron (ZOFRAN) tablet 4 mg  4 mg Oral Q6H PRN Magnant, Charles L, PA-C       Or   ondansetron (ZOFRAN) injection 4 mg  4 mg Intravenous Q6H PRN Magnant, Charles L, PA-C       oxyCODONE (Oxy IR/ROXICODONE) immediate release tablet 5-10 mg  5-10 mg  Oral Q4H PRN Karmen Bongo, MD       polyethylene glycol (MIRALAX / GLYCOLAX) packet 17 g  17 g Oral BID  Raiford Noble [place], [place]   17 g at 05/23/21 1221   rosuvastatin (CRESTOR) tablet 5 mg  5 mg Oral QHS Karmen Bongo, MD   5 mg at 05/22/21 2315   senna-docusate (Senokot-S) tablet 1 tablet  1 tablet Oral BID Raiford Noble [place], [place]   1 tablet at 05/23/21 1221     Discharge Medications: Please see discharge summary for a list of discharge medications.  Relevant Imaging Results:  Relevant Lab Results:   Additional Information SSN: [id number]  Joanne Chars, LCSW

## 2021-05-23 NOTE — Plan of Care (Signed)

## 2021-05-23 NOTE — Progress Notes (Signed)
PROGRESS NOTE    Sarah Rojas  BMW:[id number] DOB: [birth year] DOA: 05/20/2021 PCP: Brand Males, MD   Brief Narrative:  HPI per Dr. Karmen Bongo  Sarah Rojas is a 81 y.o. female with medical history significant of COPD; remote uterine cancer; and HLD presenting with a fall.   She reports that she had family visiting her last Sunday (5/14) for Mother's Day.  After they left, she was walking back into the house and she stumbled over a step, landing on her left hip.  She has had persistent pain and difficulty with ambulation since but thought it was just a bruise.  No other injuries.  She denies memory issues, although there is report of concern about this in the chart.     **Interim History Admitted for acute left hip fracture following a reported mechanical fall earlier in the week and orthopedic surgery was consulted and took the patient for a right hip arthroplasty from anterior approach and she is postoperative day 1.  PT OT evaluated and recommending home health.  Orthopedic surgery recommending weightbearing as tolerated with a walker and aspirin 81 p.o. twice daily for DVT prophylaxis    Assessment and Plan:  Hip fracture status post THA from a anterior approach postoperative day 2 -Apparently mechanical fall resulting in hip fracture -Orthopedics consulted commended weightbearing as tolerated with walker -SCDs overnight, start Lovenox post-operatively (or as per ortho) -Pain control with Tylenol, Robaxin, Oxycodone, and Morphine prn -TOC team consult for rehab placement -Will need PT consult post-operatively and they are recommending home health currently; She was able to ambulate further with PT and PT recommending [place] with 2 wheels -Patient lives alone so will need to find out a safe D/C Disposition -Hip fracture order set utilized -TXA per orthopedics -Anticipate D/C home in the next 24-48 hours    Pre-operative stratification -Orthopedic/spinal  surgery is associated with an intermediate (1-5%) cardiovascular risk for cardiac death and nonfatal MI -She has no diagnosed CVD RF (no known h/o MI or CHF) so her revised cardiac index gives a risk estimate of 3.9%, class I risk -It is reasonable for her to go to the OR without additional evaluation   COPD -Not on home O2 -SpO2: 90 % O2 Flow Rate (L/min): 2 L/min; Wean O2 and now off of it -Uses albuterol inhaler prn -Possible nodule noted on CXR; needs CT non-emergently inpatient vs. Outpatient -Of note, during her last hospitalization in 05/2019 she was recommended for repeat chest CT in 11/2019; her most recent CT was in 03/2020 with evidence of COPD but no concerning findings -She will need an ambulatory Home O2 Screen prior to D/C    Leukocytosis -WBC trended up to 10.6 in the setting of Surgical intervention and is now 9.3 -Continue monitor for signs and symptoms infection; no overt infection noted -Repeat CBC in a.m.   Acute blood loss anemia in the setting of postoperative drop -Patient's hemoglobin/hematocrit went from 12.3/38.1 -> 9.8/31.1 -> 8.6/25.8 -Check anemia panel in a.m. -Continue monitor for signs and symptoms of bleeding; no overt bleeding noted -Repeat CBC in a.m.   HLD -Continue home Rosuvastatin 5 mg po qHS -She does have evidence of CAD and aortic atherosclerosis on prior CTs and may benefit from cardiology f/u as an outpatient   Hypotension -She was previously prescribed Midodrine but has run out of this medication -Her BPs have been marginal in the ER -Will resume midodrine -Continue monitor blood pressures per protocol -Blood pressure reading is now  109/60   Anxiety -Continue Valium TID prn  Constipation -Changed bowel regimen and will schedule Miralax 17 grams po BID and Senna-Docusate 1 tab po BID  Hypophosphatemia -Mild. Phos Level is 2.3 -Replete with po K Phos Neutral 500 mg x1 -Continue to Monitor and Replete as necessary -Repeat Phos  Level in the AM  Hyponatremia -Patient's Na+ is now 134 -Continue to Monitor and Trend -Repeat CMP in the AM    Stage 3 CKDb -Uncertain current baseline, as last recorded value was in 2022 and it was (barely) stage 3a CKD -Currently stage 3b -Patient's BUNs/creatinine now 1.27/1.36 -> 22/1.40 -> 24/1.70 -Suspect that this is her baseline -Start Gentle IVF Hydration with NS at 75 mL/hr -Avoid further nephrotoxic medications, contrast dyes, hypotension and dehydration to ensure adequate renal perfusion and renally adjust medications -Repeat CMP in a.m.   Tobacco dependence -Encourage cessation.   -This was discussed with the patient and should be reviewed on an ongoing basis.   -C/w Nicotien Patch 14 mg ordered    Severe malnutrition in the context of chronic illness -Evidenced by severe muscle depletion and severe fat depletion -Nutritionist consulted and recommending Ensure Enlive p.o. 3 times daily and multivitamin with minerals and  DVT prophylaxis: SCDs Start: 05/21/21 1627 SCDs Start: 05/21/21 1430 Place TED hose Start: 05/21/21 1430    Code Status: Full Code Family Communication: No family present at bedside   Disposition Plan:  Level of care: Telemetry Surgical Status is: Inpatient Remains inpatient appropriate because: Now has a slightly worsened Renal Fxn and Hgb/Hct continues to drop slightly   Consultants:  Orthopedic Surgery   Procedures:  PROCEDURE:  Left displaced femoral neck fracture, total hip replacement using DePuy ACTIS stem size 5 with standard neck, +1 stainless steel articular head 32 mm and Gription acetabular cup measuring 48 mm with standard liner and one cancellous bone screw 25 mm.  Antimicrobials:  Anti-infectives (From admission, onward)    Start     Dose/Rate Route Frequency Ordered Stop   05/21/21 1400  ceFAZolin (ANCEF) IVPB 2g/100 mL premix        2 g 200 mL/hr over 30 Minutes Intravenous Every 8 hours 05/21/21 1429 05/22/21 0559    05/21/21 0941  vancomycin (VANCOCIN) powder  Status:  Discontinued          As needed 05/21/21 0941 05/21/21 1031   05/21/21 0600  ceFAZolin (ANCEF) IVPB 2g/100 mL premix        2 g 200 mL/hr over 30 Minutes Intravenous On call to O.R. 05/21/21 0545 05/21/21 0820       Subjective: Seen and examined at bedside and she is sitting in her chair.  States that she felt okay.  No nausea or vomiting.  Still has not had a bowel movement yet.  Denies any lightheadedness or dizziness.  No other concerns or complaints at this time.  Objective: Vitals:   05/22/21 1159 05/22/21 2025 05/23/21 0458 05/23/21 0735  BP:  (!) 103/58 (!) 118/98 116/71  Pulse:  68 88 88  Resp:  '16 18 18  '$ Temp:  98.8 F (37.1 C)  99.4 F (37.4 C)  TempSrc:  Oral  Oral  SpO2: 95% 90% 93% 90%  Weight:      Height:        Intake/Output Summary (Last 24 hours) at 05/23/2021 1212 Last data filed at 05/23/2021 0538 Gross per 24 hour  Intake 240 ml  Output 600 ml  Net -360 ml   [employer]  05/20/21 1546 05/21/21 0709  Weight: 44.5 kg 44.5 kg   Examination: Physical Exam:  Constitutional: Thin elderly Caucasian female currently sitting in the chair bedside appears calm and in no acute distress but has not had a bowel movement in several days Respiratory: Diminished to auscultation bilaterally, no wheezing, rales, rhonchi or crackles. Normal respiratory effort and patient is not tachypenic. No accessory muscle use.  Unlabored breathing and not on any supplemental oxygen nasal cannula Cardiovascular: RRR, no murmurs / rubs / gallops. S1 and S2 auscultated. No extremity edema. Abdomen: Soft, non-tender, non-distended. Bowel sounds positive.  GU: Deferred. Musculoskeletal: No clubbing / cyanosis of digits/nails. No joint deformity upper and lower extremities..  Skin: No rashes, lesions, ulcers on limited skin evaluation. No induration; Warm and dry.  Neurologic: CN 2-12 grossly intact with no focal deficits.   Psychiatric: Normal judgment and insight. Alert and oriented x 3. Normal mood and appropriate affect.   Data Reviewed: I have personally reviewed following labs and imaging studies  CBC: Recent Labs  Lab 05/20/21 1852 05/22/21 0254 05/23/21 0044  WBC 9.7 10.6* 9.3  NEUTROABS 7.1  --  7.1  HGB 12.3 9.8* 8.6*  HCT 38.1 31.1* 25.8*  MCV 95.7 101.0* 95.9  PLT 209 195 299   Basic Metabolic Panel: Recent Labs  Lab 05/20/21 1852 05/22/21 0254 05/23/21 0044  NA 136 135 134*  K 3.6 4.9 4.3  CL 103 106 107  CO2 '23 26 23  '$ GLUCOSE 84 203* 100*  BUN 27* 22 24*  CREATININE 1.36* 1.40* 1.70*  CALCIUM 8.7* 8.0* 7.9*  MG  --   --  2.1  PHOS  --   --  2.3*   GFR: Estimated Creatinine Clearance: 18.5 mL/min (A) (by C-G formula based on SCr of 1.7 mg/dL (H)). Liver Function Tests: Recent Labs  Lab 05/20/21 1852 05/23/21 0044  AST 32 26  ALT 16 9  ALKPHOS 61 57  BILITOT 0.3 0.2*  PROT 6.8 4.7*  ALBUMIN 3.8 2.3*   No results for input(s): LIPASE, AMYLASE in the last 168 hours. No results for input(s): AMMONIA in the last 168 hours. Coagulation Profile: Recent Labs  Lab 05/21/21 1514  INR 1.0   Cardiac Enzymes: No results for input(s): CKTOTAL, CKMB, CKMBINDEX, TROPONINI in the last 168 hours. BNP (last 3 results) No results for input(s): PROBNP in the last 8760 hours. HbA1C: No results for input(s): HGBA1C in the last 72 hours. CBG: No results for input(s): GLUCAP in the last 168 hours. Lipid Profile: No results for input(s): CHOL, HDL, LDLCALC, TRIG, CHOLHDL, LDLDIRECT in the last 72 hours. Thyroid Function Tests: No results for input(s): TSH, T4TOTAL, FREET4, T3FREE, THYROIDAB in the last 72 hours. Anemia Panel: No results for input(s): VITAMINB12, FOLATE, FERRITIN, TIBC, IRON, RETICCTPCT in the last 72 hours. Sepsis Labs: No results for input(s): PROCALCITON, LATICACIDVEN in the last 168 hours.  Recent Results (from the past 240 hour(s))  Surgical pcr screen      Status: Abnormal   Collection Time: 05/21/21  6:57 AM   Specimen: Nasal Mucosa; Nasal Swab  Result Value Ref Range Status   MRSA, PCR NEGATIVE NEGATIVE Final   Staphylococcus aureus POSITIVE (A) NEGATIVE Final    Comment: (NOTE) The Xpert SA Assay (FDA approved for NASAL specimens in patients 28 years of age and older), is one component of a comprehensive surveillance program. It is not intended to diagnose infection nor to guide or monitor treatment. Performed at [facility], [place]  [address] [place], [place] [address]     Radiology Studies: No results found.  Scheduled Meds:  aspirin  81 mg Oral BID   chlorhexidine  60 mL Topical Once   Chlorhexidine Gluconate Cloth  6 each Topical Daily   feeding supplement  237 mL Oral TID BM   midodrine  5 mg Oral TID WC   multivitamin with minerals  1 tablet Oral Daily   mupirocin ointment  1 application. Nasal BID   polyethylene glycol  17 g Oral BID   rosuvastatin  5 mg Oral QHS   senna-docusate  1 tablet Oral BID   Continuous Infusions:  sodium chloride     lactated ringers     methocarbamol (ROBAXIN) IV     methocarbamol (ROBAXIN) IV      LOS: 2 days   Raiford Noble, DO [facility] Available via Epic secure chat 7am-7pm After these hours, please refer to coverage provider listed on [email or web address] 05/23/2021, 12:12 PM

## 2021-05-23 NOTE — Progress Notes (Signed)
  Subjective: Sarah Rojas is a 81 y.o. female s/p left THA.  They are POD2.  Pt's pain is controlled but moderate. Pt has ambulated with some difficulty.  Able to ambulate further and PT today relative to yesterday and she denies any dizziness or lightheadedness with ambulation. Denies chest pain, SOB, abd pain or significant calf pain.  Hemoglobin dropped 8.6 last night with creatinine up to 1.7 today.  Objective: Vital signs in last 24 hours: Temp:  [98.8 F (37.1 C)-99.4 F (37.4 C)] 99.4 F (37.4 C) (05/23 0735) Pulse Rate:  [68-88] 88 (05/23 0735) Resp:  [16-18] 18 (05/23 0735) BP: (103-118)/(58-98) 116/71 (05/23 0735) SpO2:  [90 %-95 %] 90 % (05/23 0735)  Intake/Output from previous day: 05/22 0701 - 05/23 0700 In: 600 [P.O.:600] Out: 600 [Urine:600] Intake/Output this shift: No intake/output data recorded.  Exam:  No gross blood or drainage overlying the dressing 2+ DP pulse Sensation intact distally in the left foot Able to dorsiflex and plantarflex the left foot No calf TTP, neg homans   Labs: Recent Labs    05/20/21 1852 05/22/21 0254 05/23/21 0044  HGB 12.3 9.8* 8.6*   Recent Labs    05/22/21 0254 05/23/21 0044  WBC 10.6* 9.3  RBC 3.08* 2.69*  HCT 31.1* 25.8*  PLT 195 198   Recent Labs    05/22/21 0254 05/23/21 0044  NA 135 134*  K 4.9 4.3  CL 106 107  CO2 26 23  BUN 22 24*  CREATININE 1.40* 1.70*  GLUCOSE 203* 100*  CALCIUM 8.0* 7.9*   Recent Labs    05/21/21 1514  INR 1.0    Assessment/Plan: Pt is POD2 s/p left THA.    -Disposition pending medical team clearance.    -WBAT with a walker  -Aspirin '81mg'$  BID for DVT prophylaxis   Donella Stade 05/23/2021, 10:48 AM

## 2021-05-24 DIAGNOSIS — F419 Anxiety disorder, unspecified: Secondary | ICD-10-CM | POA: Diagnosis not present

## 2021-05-24 DIAGNOSIS — J449 Chronic obstructive pulmonary disease, unspecified: Secondary | ICD-10-CM | POA: Diagnosis not present

## 2021-05-24 DIAGNOSIS — S72002A Fracture of unspecified part of neck of left femur, initial encounter for closed fracture: Secondary | ICD-10-CM | POA: Diagnosis not present

## 2021-05-24 DIAGNOSIS — N1832 Chronic kidney disease, stage 3b: Secondary | ICD-10-CM | POA: Diagnosis not present

## 2021-05-24 LAB — COMPREHENSIVE METABOLIC PANEL
ALT: 7 U/L (ref 0–44)
AST: 29 U/L (ref 15–41)
Albumin: 2.4 g/dL — ABNORMAL LOW (ref 3.5–5.0)
Alkaline Phosphatase: 57 U/L (ref 38–126)
Anion gap: 4 — ABNORMAL LOW (ref 5–15)
BUN: 29 mg/dL — ABNORMAL HIGH (ref 8–23)
CO2: 24 mmol/L (ref 22–32)
Calcium: 8.6 mg/dL — ABNORMAL LOW (ref 8.9–10.3)
Chloride: 109 mmol/L (ref 98–111)
Creatinine, Ser: 1.2 mg/dL — ABNORMAL HIGH (ref 0.44–1.00)
GFR, Estimated: 46 mL/min — ABNORMAL LOW (ref >60)
Glucose, Bld: 102 mg/dL — ABNORMAL HIGH (ref 70–99)
Potassium: 4 mmol/L (ref 3.5–5.1)
Sodium: 137 mmol/L (ref 135–145)
Total Bilirubin: 0.4 mg/dL (ref 0.3–1.2)
Total Protein: 5.3 g/dL — ABNORMAL LOW (ref 6.5–8.1)

## 2021-05-24 LAB — CBC WITH DIFFERENTIAL/PLATELET
Abs Immature Granulocytes: 0.05 10*3/uL (ref 0.00–0.07)
Basophils Absolute: 0 10*3/uL (ref 0.0–0.1)
Basophils Relative: 0 %
Eosinophils Absolute: 0.3 10*3/uL (ref 0.0–0.5)
Eosinophils Relative: 3 %
HCT: 29.1 % — ABNORMAL LOW (ref 36.0–46.0)
Hemoglobin: 10 g/dL — ABNORMAL LOW (ref 12.0–15.0)
Immature Granulocytes: 1 %
Lymphocytes Relative: 13 %
Lymphs Abs: 1.3 10*3/uL (ref 0.7–4.0)
MCH: 32.6 pg (ref 26.0–34.0)
MCHC: 34.4 g/dL (ref 30.0–36.0)
MCV: 94.8 fL (ref 80.0–100.0)
Monocytes Absolute: 0.9 10*3/uL (ref 0.1–1.0)
Monocytes Relative: 9 %
Neutro Abs: 7.2 10*3/uL (ref 1.7–7.7)
Neutrophils Relative %: 74 %
Platelets: 266 10*3/uL (ref 150–400)
RBC: 3.07 MIL/uL — ABNORMAL LOW (ref 3.87–5.11)
RDW: 13.9 % (ref 11.5–15.5)
WBC: 9.7 10*3/uL (ref 4.0–10.5)
nRBC: 0 % (ref 0.0–0.2)

## 2021-05-24 LAB — FOLATE: Folate: 13.4 ng/mL (ref >5.9)

## 2021-05-24 LAB — IRON AND TIBC
Iron: 42 ug/dL (ref 28–170)
Saturation Ratios: 19 % (ref 10.4–31.8)
TIBC: 225 ug/dL — ABNORMAL LOW (ref 250–450)
UIBC: 183 ug/dL

## 2021-05-24 LAB — RETICULOCYTES
Immature Retic Fract: 23 % — ABNORMAL HIGH (ref 2.3–15.9)
RBC.: 3.05 MIL/uL — ABNORMAL LOW (ref 3.87–5.11)
Retic Count, Absolute: 62.8 10*3/uL (ref 19.0–186.0)
Retic Ct Pct: 2.1 % (ref 0.4–3.1)

## 2021-05-24 LAB — FERRITIN: Ferritin: 163 ng/mL (ref 11–307)

## 2021-05-24 LAB — MAGNESIUM: Magnesium: 2.1 mg/dL (ref 1.7–2.4)

## 2021-05-24 LAB — PHOSPHORUS: Phosphorus: 2.4 mg/dL — ABNORMAL LOW (ref 2.5–4.6)

## 2021-05-24 LAB — VITAMIN B12: Vitamin B-12: 406 pg/mL (ref 180–914)

## 2021-05-24 MED ORDER — BISACODYL 10 MG RE SUPP
10.0000 mg | Freq: Once | RECTAL | Status: AC
  Administered 2021-05-24: 10 mg via RECTAL
  Filled 2021-05-24: qty 1

## 2021-05-24 MED ORDER — SIMETHICONE 80 MG PO CHEW
160.0000 mg | CHEWABLE_TABLET | Freq: Four times a day (QID) | ORAL | Status: DC
  Administered 2021-05-24 – 2021-05-25 (×4): 160 mg via ORAL
  Filled 2021-05-24 (×4): qty 2

## 2021-05-24 MED ORDER — MOMETASONE FURO-FORMOTEROL FUM 200-5 MCG/ACT IN AERO
2.0000 | INHALATION_SPRAY | Freq: Two times a day (BID) | RESPIRATORY_TRACT | Status: DC
  Administered 2021-05-24 – 2021-05-25 (×3): 2 via RESPIRATORY_TRACT
  Filled 2021-05-24: qty 8.8

## 2021-05-24 MED ORDER — UMECLIDINIUM BROMIDE 62.5 MCG/ACT IN AEPB
1.0000 | INHALATION_SPRAY | Freq: Every day | RESPIRATORY_TRACT | Status: DC
  Administered 2021-05-24: 1 via RESPIRATORY_TRACT
  Filled 2021-05-24: qty 7

## 2021-05-24 NOTE — TOC Progression Note (Addendum)
Transition of Care [facility]) - Progression Note    Patient Details  Name: Sarah Rojas MRN: [id number] Date of Birth: October 02, 1940  Transition of [facility] CM/SW Contact  Joanne Chars, LCSW Phone Number: 05/24/2021, 8:26 AM  Clinical Narrative:   CSW received message that SNF auth approved by HTA: SNF: [phone], [facility] transport: [phone].  0940: CSW presented bed offers to pt, she asked CSW to call son Truman Hayward.  CSW spoke with Truman Hayward by phone, gave him the offers, he will review and get back to CSW.  1255: CSW spoke with pt and son Truman Hayward in room, they would like to choose [place].  [facility] informed.  HTA informed.    Expected Discharge Plan: [place] Barriers to Discharge: Continued Medical Work up, Other (must enter comment) ([place] recommendation with HTA insurance)  Expected Discharge Plan and Services Expected Discharge Plan: [place] In-house Referral: Clinical Social Work   Post Acute Care Choice: [place] arrangements for the past 2 months: Single Family Home                                       Social Determinants of Health (SDOH) Interventions    Readmission Risk Interventions     View : No data to display.

## 2021-05-24 NOTE — Progress Notes (Signed)
PROGRESS NOTE    Sarah Rojas  AOZ:[id number] DOB: 04/24/1940 DOA: 05/20/2021 PCP: Brand Males, MD    Chief Complaint  Patient presents with   Fall    Brief Narrative:  HPI per Dr. Karmen Bongo  Sarah Rojas is a 81 y.o. female with medical history significant of COPD; remote uterine cancer; and HLD presenting with a fall.   She reports that she had family visiting her last Sunday (5/14) for Mother's Day.  After they left, she was walking back into the house and she stumbled over a step, landing on her left hip.  She has had persistent pain and difficulty with ambulation since but thought it was just a bruise.  No other injuries.  She denies memory issues, although there is report of concern about this in the chart.     **Interim History Admitted for acute left hip fracture following a reported mechanical fall earlier in the week and orthopedic surgery was consulted and took the patient for a right hip arthroplasty from anterior approach and she is postoperative day 1.  PT OT evaluated and recommended home health.  Orthopedic surgery recommending weightbearing as tolerated with a walker and aspirin 81 p.o. twice daily for DVT prophylaxis. -Patient noted to live alone, due to unsafe disposition patient likely to go to SNF.   Assessment & Plan:   Principal Problem:   Displaced fracture of left femoral neck (HCC) Active Problems:   Tobacco abuse   COPD not affecting current episode of care ([place])   Dyslipidemia   Hypotension, chronic   Anxiety disorder   Chronic kidney disease, stage 3b ([place])   Protein-calorie malnutrition, severe   Closed fracture of left hip (HCC)  #1 left femoral neck fracture/hip fracture status post THA from a anterior approach postop day 2 -Secondary to mechanical fall. -Orthopedics consulted and patient subsequently underwent left total hip replacement 05/21/2021 without any complications. -WBAT with walker per orthopedics. -Aspirin 81 mg  twice daily for DVT prophylaxis. -PT/OT. -Per orthopedics.  2.  Preoperative stratification Orthopedic/spinal surgery is associated with an intermediate (1-5%) cardiovascular risk for cardiac death and nonfatal MI -She has no diagnosed CVD RF (no known h/o MI or CHF) so her revised cardiac index gives a risk estimate of 3.9%, class I risk -It is reasonable for her to go to the OR without additional evaluation.  3.  COPD -Not on home O2. -Patient understands of 90% on room air. -Possible nodule noted on chest x-ray recommending nonemergent CT scan which may be done in the outpatient setting. -It is noted during last hospitalization 05/2019 she was recommended for repeat chest CT in 11/2019.  Most recent CT was 03/2020 with evidence of COPD but no concerning findings. -Place on Dulera and Incruse. -Albuterol inhaler as needed.  4.  Leukocytosis -Likely secondary to reactive leukocytosis. -Patient with no overt infection. -Leukocytosis improved.  5.  Postop acute blood loss anemia -Patient with no overt bleeding. -Anemia panel consistent with anemia of chronic disease. -Vitamin B12 level of 406. -Follow H&H. -Transfusion threshold hemoglobin < 7.  6.  Hyperlipidemia -Continue statin.  7.  Chronic hypotension -Patient noted to have been on midodrine in the past and noted to have run out of her medication. -Midodrine resumed. -Follow-up.  8.  Anxiety -Continue Valium 3 times daily as needed.  9.  Constipation -Continue current bowel regimen of MiraLAX twice daily, Senokot-S twice daily. -We will give a Dulcolax suppository x1.  10.  Hypophosphatemia -Phosphorus at 2.4. -Follow-up  11.  CKD stage IIIb -Stable. -Patient noted to have a bump in creatinine to 1.70, placed on IV fluids with improvement with creatinine currently at 1.20. -Saline lock IV fluids.  12.  Tobacco dependence -Tobacco cessation stressed to patient. -Continue nicotine patch.  13.  Severe  malnutrition in the context of chronic illness -Patient noted with severe muscle depletion and severe fat depletion. -Nutritional supplementation. -Nutritionist consulted and following.   DVT prophylaxis: Aspirin. Code Status: Full Family Communication: Updated patient.  No family at bedside Disposition: SNF hopefully in the next 24 hours.  Status is: Inpatient Remains inpatient appropriate because: Severity of illness/unsafe disposition   Consultants:  Orthopedics: Dr. Marlou Sa 05/21/2021  Procedures:  CT head without contrast 05/20/2021 Chest x-ray 05/20/2021, 05/21/2021 Plain films of the left hip and pelvis 05/21/2021 Left displaced femoral neck fracture, total hip replacement, per orthopedics: Dr. Marlou Sa 05/21/2021   Antimicrobials:  None   Subjective: Patient sitting up in bed.  No chest pain.  No shortness of breath.  No abdominal pain.  Overall feeling better after hip repair.  Objective: Vitals:   05/23/21 0735 05/23/21 2001 05/24/21 0415 05/24/21 1158  BP: 116/71 (!) 135/56 (!) 163/74 (!) 149/63  Pulse: 88 81 (!) 104 94  Resp: '18 16 16 16  '$ Temp: 99.4 F (37.4 C) 98.2 F (36.8 C)  98.1 F (36.7 C)  TempSrc: Oral Oral  Oral  SpO2: 90% 90% 90% (!) 89%  Weight:      Height:        Intake/Output Summary (Last 24 hours) at 05/24/2021 1638 Last data filed at 05/24/2021 1431 Gross per 24 hour  Intake 2095.84 ml  Output --  Net 2095.84 ml   Filed Weights   05/20/21 1546 05/21/21 0709  Weight: 44.5 kg 44.5 kg    Examination:  General exam: Appears calm and comfortable  Respiratory system: Clear to auscultation.  Fair air movement.  Speaking in full sentences.  Respiratory effort normal. Cardiovascular system: S1 & S2 heard, RRR. No JVD, murmurs, rubs, gallops or clicks. No pedal edema. Gastrointestinal system: Abdomen is nondistended, soft and nontender. No organomegaly or masses felt. Normal bowel sounds heard. Central nervous system: Alert and oriented. No focal  neurological deficits. Extremities: Left hip with postop dressing in place.  Skin: No rashes, lesions or ulcers Psychiatry: Judgement and insight appear normal. Mood & affect appropriate.     Data Reviewed: I have personally reviewed following labs and imaging studies  CBC: Recent Labs  Lab 05/20/21 1852 05/22/21 0254 05/23/21 0044 05/24/21 0336  WBC 9.7 10.6* 9.3 9.7  NEUTROABS 7.1  --  7.1 7.2  HGB 12.3 9.8* 8.6* 10.0*  HCT 38.1 31.1* 25.8* 29.1*  MCV 95.7 101.0* 95.9 94.8  PLT 209 195 198 709    Basic Metabolic Panel: Recent Labs  Lab 05/20/21 1852 05/22/21 0254 05/23/21 0044 05/24/21 0336  NA 136 135 134* 137  K 3.6 4.9 4.3 4.0  CL 103 106 107 109  CO2 '23 26 23 24  '$ GLUCOSE 84 203* 100* 102*  BUN 27* 22 24* 29*  CREATININE 1.36* 1.40* 1.70* 1.20*  CALCIUM 8.7* 8.0* 7.9* 8.6*  MG  --   --  2.1 2.1  PHOS  --   --  2.3* 2.4*    GFR: Estimated Creatinine Clearance: 26.3 mL/min (A) (by C-G formula based on SCr of 1.2 mg/dL (H)).  Liver Function Tests: Recent Labs  Lab 05/20/21 1852 05/23/21 0044 05/24/21 0336  AST 32 26 29  ALT '16 9 7  '$ ALKPHOS 61 57 57  BILITOT 0.3 0.2* 0.4  PROT 6.8 4.7* 5.3*  ALBUMIN 3.8 2.3* 2.4*    CBG: No results for input(s): GLUCAP in the last 168 hours.   Recent Results (from the past 240 hour(s))  Surgical pcr screen     Status: Abnormal   Collection Time: 05/21/21  6:57 AM   Specimen: Nasal Mucosa; Nasal Swab  Result Value Ref Range Status   MRSA, PCR NEGATIVE NEGATIVE Final   Staphylococcus aureus POSITIVE (A) NEGATIVE Final    Comment: (NOTE) The Xpert SA Assay (FDA approved for NASAL specimens in patients 52 years of age and older), is one component of a comprehensive surveillance program. It is not intended to diagnose infection nor to guide or monitor treatment. Performed at [facility], [place] [address] [place], [place] [address]          Radiology Studies: No results found.      Scheduled  Meds:  aspirin  81 mg Oral BID   chlorhexidine  60 mL Topical Once   Chlorhexidine Gluconate Cloth  6 each Topical Daily   feeding supplement  237 mL Oral TID BM   midodrine  5 mg Oral TID WC   mometasone-formoterol  2 puff Inhalation BID   multivitamin with minerals  1 tablet Oral Daily   mupirocin ointment  1 application. Nasal BID   polyethylene glycol  17 g Oral BID   rosuvastatin  5 mg Oral QHS   senna-docusate  1 tablet Oral BID   simethicone  160 mg Oral QID   umeclidinium bromide  1 puff Inhalation Daily   Continuous Infusions:  methocarbamol (ROBAXIN) IV     methocarbamol (ROBAXIN) IV       LOS: 3 days    Time spent: 35 minutes    Irine Seal, MD [facility]   To contact the attending provider between 7A-7P or the covering provider during after hours 7P-7A, please log into the web site [email or web address] and access using universal  password for that web site. If you do not have the password, please call the hospital operator.  05/24/2021, 4:38 PM

## 2021-05-24 NOTE — Care Management Important Message (Signed)
Important Message  Patient Details  Name: Sarah Rojas MRN: [id number] Date of Birth: September 06, 1940   Medicare Important Message Given:  Yes     Orbie Pyo 05/24/2021, 2:57 PM

## 2021-05-25 DIAGNOSIS — E43 Unspecified severe protein-calorie malnutrition: Secondary | ICD-10-CM | POA: Diagnosis not present

## 2021-05-25 DIAGNOSIS — M6281 Muscle weakness (generalized): Secondary | ICD-10-CM | POA: Diagnosis not present

## 2021-05-25 DIAGNOSIS — R41841 Cognitive communication deficit: Secondary | ICD-10-CM | POA: Diagnosis not present

## 2021-05-25 DIAGNOSIS — R2681 Unsteadiness on feet: Secondary | ICD-10-CM | POA: Diagnosis not present

## 2021-05-25 DIAGNOSIS — I9589 Other hypotension: Secondary | ICD-10-CM | POA: Diagnosis not present

## 2021-05-25 DIAGNOSIS — Z741 Need for assistance with personal care: Secondary | ICD-10-CM | POA: Diagnosis not present

## 2021-05-25 DIAGNOSIS — N1832 Chronic kidney disease, stage 3b: Secondary | ICD-10-CM | POA: Diagnosis not present

## 2021-05-25 DIAGNOSIS — R911 Solitary pulmonary nodule: Secondary | ICD-10-CM | POA: Diagnosis not present

## 2021-05-25 DIAGNOSIS — S72002S Fracture of unspecified part of neck of left femur, sequela: Secondary | ICD-10-CM

## 2021-05-25 DIAGNOSIS — M6259 Muscle wasting and atrophy, not elsewhere classified, multiple sites: Secondary | ICD-10-CM | POA: Diagnosis not present

## 2021-05-25 DIAGNOSIS — K5900 Constipation, unspecified: Secondary | ICD-10-CM | POA: Diagnosis not present

## 2021-05-25 DIAGNOSIS — F419 Anxiety disorder, unspecified: Secondary | ICD-10-CM | POA: Diagnosis not present

## 2021-05-25 DIAGNOSIS — I959 Hypotension, unspecified: Secondary | ICD-10-CM | POA: Diagnosis not present

## 2021-05-25 DIAGNOSIS — R278 Other lack of coordination: Secondary | ICD-10-CM | POA: Diagnosis not present

## 2021-05-25 DIAGNOSIS — E785 Hyperlipidemia, unspecified: Secondary | ICD-10-CM | POA: Diagnosis not present

## 2021-05-25 DIAGNOSIS — R4189 Other symptoms and signs involving cognitive functions and awareness: Secondary | ICD-10-CM | POA: Diagnosis not present

## 2021-05-25 DIAGNOSIS — Z743 Need for continuous supervision: Secondary | ICD-10-CM | POA: Diagnosis not present

## 2021-05-25 DIAGNOSIS — Z720 Tobacco use: Secondary | ICD-10-CM | POA: Diagnosis not present

## 2021-05-25 DIAGNOSIS — S72002A Fracture of unspecified part of neck of left femur, initial encounter for closed fracture: Secondary | ICD-10-CM | POA: Diagnosis not present

## 2021-05-25 DIAGNOSIS — W19XXXA Unspecified fall, initial encounter: Secondary | ICD-10-CM | POA: Diagnosis not present

## 2021-05-25 DIAGNOSIS — D62 Acute posthemorrhagic anemia: Secondary | ICD-10-CM | POA: Diagnosis not present

## 2021-05-25 DIAGNOSIS — Z96642 Presence of left artificial hip joint: Secondary | ICD-10-CM | POA: Diagnosis not present

## 2021-05-25 DIAGNOSIS — J449 Chronic obstructive pulmonary disease, unspecified: Secondary | ICD-10-CM | POA: Diagnosis not present

## 2021-05-25 DIAGNOSIS — R29818 Other symptoms and signs involving the nervous system: Secondary | ICD-10-CM | POA: Diagnosis not present

## 2021-05-25 DIAGNOSIS — Z4789 Encounter for other orthopedic aftercare: Secondary | ICD-10-CM | POA: Diagnosis not present

## 2021-05-25 LAB — RENAL FUNCTION PANEL
Albumin: 2.3 g/dL — ABNORMAL LOW (ref 3.5–5.0)
Anion gap: 3 — ABNORMAL LOW (ref 5–15)
BUN: 25 mg/dL — ABNORMAL HIGH (ref 8–23)
CO2: 25 mmol/L (ref 22–32)
Calcium: 8.6 mg/dL — ABNORMAL LOW (ref 8.9–10.3)
Chloride: 109 mmol/L (ref 98–111)
Creatinine, Ser: 1.3 mg/dL — ABNORMAL HIGH (ref 0.44–1.00)
GFR, Estimated: 42 mL/min — ABNORMAL LOW (ref >60)
Glucose, Bld: 112 mg/dL — ABNORMAL HIGH (ref 70–99)
Phosphorus: 3.5 mg/dL (ref 2.5–4.6)
Potassium: 4.5 mmol/L (ref 3.5–5.1)
Sodium: 137 mmol/L (ref 135–145)

## 2021-05-25 LAB — CBC
HCT: 27.4 % — ABNORMAL LOW (ref 36.0–46.0)
Hemoglobin: 8.8 g/dL — ABNORMAL LOW (ref 12.0–15.0)
MCH: 31.9 pg (ref 26.0–34.0)
MCHC: 32.1 g/dL (ref 30.0–36.0)
MCV: 99.3 fL (ref 80.0–100.0)
Platelets: 256 10*3/uL (ref 150–400)
RBC: 2.76 MIL/uL — ABNORMAL LOW (ref 3.87–5.11)
RDW: 14.2 % (ref 11.5–15.5)
WBC: 7.5 10*3/uL (ref 4.0–10.5)
nRBC: 0 % (ref 0.0–0.2)

## 2021-05-25 MED ORDER — NICOTINE 14 MG/24HR TD PT24: 14.0000 mg | MEDICATED_PATCH | Freq: Every day | TRANSDERMAL | 0 refills | Status: DC | PRN

## 2021-05-25 MED ORDER — MIDODRINE HCL 5 MG PO TABS: 5.0000 mg | ORAL_TABLET | Freq: Three times a day (TID) | ORAL | 1 refills | Status: AC

## 2021-05-25 MED ORDER — POLYETHYLENE GLYCOL 3350 17 G PO PACK: 17.0000 g | PACK | Freq: Two times a day (BID) | ORAL | 0 refills | Status: DC

## 2021-05-25 MED ORDER — SORBITOL 70 % SOLN
960.0000 mL | TOPICAL_OIL | Freq: Once | ORAL | Status: AC
  Administered 2021-05-25: 960 mL via RECTAL
  Filled 2021-05-25: qty 473

## 2021-05-25 MED ORDER — SIMETHICONE 80 MG PO CHEW
160.0000 mg | CHEWABLE_TABLET | Freq: Four times a day (QID) | ORAL | 0 refills | Status: DC
Start: 2021-05-25 — End: 2023-09-17

## 2021-05-25 MED ORDER — MOMETASONE FURO-FORMOTEROL FUM 200-5 MCG/ACT IN AERO: 2.0000 | INHALATION_SPRAY | Freq: Two times a day (BID) | RESPIRATORY_TRACT | 1 refills | Status: DC

## 2021-05-25 MED ORDER — SENNOSIDES-DOCUSATE SODIUM 8.6-50 MG PO TABS: 1.0000 | ORAL_TABLET | Freq: Two times a day (BID) | ORAL | Status: DC

## 2021-05-25 MED ORDER — ACETAMINOPHEN 325 MG PO TABS
650.0000 mg | ORAL_TABLET | Freq: Four times a day (QID) | ORAL | Status: AC | PRN
Start: 2021-05-25 — End: ?

## 2021-05-25 MED ORDER — UMECLIDINIUM BROMIDE 62.5 MCG/ACT IN AEPB: 1.0000 | INHALATION_SPRAY | Freq: Every day | RESPIRATORY_TRACT | 1 refills | Status: DC

## 2021-05-25 MED ORDER — SORBITOL 70 % SOLN
30.0000 mL | Status: AC
  Administered 2021-05-25: 30 mL via ORAL
  Filled 2021-05-25 (×2): qty 30

## 2021-05-25 MED ORDER — OXYCODONE HCL 5 MG PO TABS: 5.0000 mg | ORAL_TABLET | ORAL | 0 refills | Status: DC | PRN

## 2021-05-25 MED ORDER — ENSURE ENLIVE PO LIQD: 237.0000 mL | Freq: Three times a day (TID) | ORAL | 12 refills | Status: AC

## 2021-05-25 MED ORDER — ASPIRIN 81 MG PO CHEW
81.0000 mg | CHEWABLE_TABLET | Freq: Two times a day (BID) | ORAL | Status: AC
Start: 2021-05-25 — End: ?

## 2021-05-25 MED ORDER — DIAZEPAM 5 MG PO TABS: 5.0000 mg | ORAL_TABLET | Freq: Three times a day (TID) | ORAL | 0 refills | Status: AC | PRN

## 2021-05-25 NOTE — TOC Transition Note (Signed)
Transition of Care [facility]) - CM/SW Discharge Note   Patient Details  Name: Sarah Rojas MRN: [id number] Date of Birth: 03/25/1940  [facility] CM/SW Contact:  Joanne Chars, LCSW Phone Number: 05/25/2021, 1:26 PM   Clinical Narrative:   Pt discharging to [facility].  RN call report to [phone].     Final next level of care: Skilled Nursing Facility Barriers to Discharge: Barriers Resolved   Patient Goals and CMS Choice Patient states their goals for this hospitalization and ongoing recovery are:: get home CMS Medicare.gov Compare Post Acute Care list provided to:: Patient Choice offered to / list presented to : Patient  Discharge Placement              Patient chooses bed at: [facility] Patient to be transferred to facility by: [place] Name of family member notified: son Truman Hayward Patient and family notified of of transfer: 05/25/21  Discharge Plan and Services In-house Referral: Clinical Social Work   Post Acute Care Choice: Home Health                               Social Determinants of Health (SDOH) Interventions     Readmission Risk Interventions     View : No data to display.

## 2021-05-25 NOTE — Progress Notes (Signed)
Report given to [facility]. Patient awaiting PTAR

## 2021-05-25 NOTE — Progress Notes (Signed)
Physical Therapy Treatment Patient Details Name: Sarah Rojas MRN: [id number] DOB: 1940-11-04 Today's Date: 05/25/2021   History of Present Illness Pt is an 81 y.o. female who presented to the ED on 5/20 following a fall at home. She fell onto her L hip 5/14 but initially only suspected it was a deep bruise. She went to the ED several days later after persistent pain and difficulty ambulating. Xray revealed L hip fx. She underwent L THA 5/20. PMH: COPD; remote uterine cancer; and HLD    PT Comments    Received pt semi-reclined in bed and reluctantly agreeable to PT treatment, reporting discomfort from constipation - RN provided medication prior to session. Pt performed bed mobility with supervision using bed features and transfers with RW and min guard. Pt ambulated into hallway, but was limited by weakness in LEs and requested to return to bed. Continue to recommend HHPT at this time. Acute PT to cont to follow.     Recommendations for follow up therapy are one component of a multi-disciplinary discharge planning process, led by the attending physician.  Recommendations may be updated based on patient status, additional functional criteria and insurance authorization.  Follow Up Recommendations  Home health PT     Assistance Recommended at Discharge Intermittent Supervision/Assistance  Patient can return home with the following A little help with walking and/or transfers;A little help with bathing/dressing/bathroom;Assistance with cooking/housework;Assist for transportation;Help with stairs or ramp for entrance;Direct supervision/assist for medications management   Equipment Recommendations  Rolling walker (2 wheels)    Recommendations for Other Services       Precautions / Restrictions Precautions Precautions: Fall Restrictions Weight Bearing Restrictions: Yes LLE Weight Bearing: Weight bearing as tolerated     Mobility  Bed Mobility Overal bed mobility: Needs  Assistance Bed Mobility: Supine to Sit, Sit to Supine     Supine to sit: Supervision, HOB elevated Sit to supine: Supervision, HOB elevated   General bed mobility comments: pt required increased time and use of bed features Patient Response: Flat affect, Cooperative  Transfers Overall transfer level: Needs assistance Equipment used: Rolling walker (2 wheels) Transfers: Sit to/from Stand Sit to Stand: Min guard                Ambulation/Gait Ambulation/Gait assistance: Min guard Gait Distance (Feet): 36 Feet Assistive device: Rolling walker (2 wheels) Gait Pattern/deviations: Trunk flexed, Decreased step length - right, Decreased step length - left, Decreased stride length, Step-through pattern, Narrow base of support Gait velocity: decreased Gait velocity interpretation: <1.31 ft/sec, indicative of household ambulator   General Gait Details: generalized unsteadiness when turning   Marine scientist Rank[place] (Stroke Patients Only)       Balance Overall balance assessment: Needs assistance Sitting-balance support: Feet supported, No upper extremity supported Sitting balance-Leahy Scale: Good     Standing balance support: Bilateral upper extremity supported, During functional activity, Reliant on assistive device for balance Standing balance-Leahy Scale: Poor Standing balance comment: able to maintain static standing balance with supervision, but required min guard for dynamic standing balance                            Cognition Arousal/Alertness: Awake/alert Behavior During Therapy: WFL for tasks assessed/performed, Flat affect Overall Cognitive Status: Within Functional Limits for tasks assessed  General Comments: flat affect, limited motivation        Exercises      General Comments        Pertinent Vitals/Pain Pain Assessment Pain Assessment:  No/denies pain    Home Living                          Prior Function            PT Goals (current goals can now be found in the care plan section) Acute Rehab PT Goals Patient Stated Goal: home PT Goal Formulation: With patient Time For Goal Achievement: 05/31/21 Potential to Achieve Goals: Good Progress towards PT goals: Progressing toward goals    Frequency    Min 3X/week      PT Plan Current plan remains appropriate    Co-evaluation              AM-PAC PT "6 Clicks" Mobility   Outcome Measure  Help needed turning from your back to your side while in a flat bed without using bedrails?: A Little Help needed moving from lying on your back to sitting on the side of a flat bed without using bedrails?: A Little Help needed moving to and from a bed to a chair (including a wheelchair)?: A Little Help needed standing up from a chair using your arms (e.g., wheelchair or bedside chair)?: A Little Help needed to walk in hospital room?: A Little Help needed climbing 3-5 steps with a railing? : A Little 6 Click Score: 18    End of Session   Activity Tolerance: Patient limited by fatigue Patient left: with call bell/phone within reach;in bed;with bed alarm set Nurse Communication: Mobility status PT Visit Diagnosis: Muscle weakness (generalized) (M62.81);Difficulty in walking, not elsewhere classified (R26.2);Unsteadiness on feet (R26.81);History of falling (Z91.81)     Time: 3903-0092 PT Time Calculation (min) (ACUTE ONLY): 13 min  Charges:  $Gait Training: 8-22 mins                     Becky Sax PT, DPT  Blenda Nicely 05/25/2021, 11:16 AM

## 2021-05-25 NOTE — Progress Notes (Signed)
Very large BM, tolerated procedure well. Notification to MD, RN, SW

## 2021-05-25 NOTE — Discharge Summary (Signed)
Physician Discharge Summary  Sarah Rojas XNA:[id number] DOB: 1940-09-08 DOA: 05/20/2021  PCP: Brand Males, MD  Admit date: 05/20/2021 Discharge date: 05/25/2021  Time spent: 60 minutes  Recommendations for Outpatient Follow-up:  Follow-up with Dr. Marlou Sa, orthopedics 2 weeks. Follow-up with MD at skilled nursing facility.  Patient will need a basic metabolic profile done in 1 week to follow-up on electrolytes and renal function.  Patient also need a CBC done to follow-up on H&H.   Discharge Diagnoses:  Principal Problem:   Displaced fracture of left femoral neck (HCC) Active Problems:   Tobacco abuse   COPD not affecting current episode of care ([place])   Dyslipidemia   Hypotension, chronic   Anxiety disorder   Chronic kidney disease, stage 3b (HCC)   Protein-calorie malnutrition, severe   Closed fracture of left hip (HCC)   Constipation   Discharge Condition: Stable and improved  Diet recommendation: Regular  Filed Weights   05/20/21 1546 05/21/21 0709  Weight: 44.5 kg 44.5 kg    History of present illness:  HPI as per Dr. Loman Sarah Rojas is a 81 y.o. female with medical history significant of COPD; remote uterine cancer; and HLD presenting with a fall.   She reports that she had family visiting her last Sunday (5/14) for Mother's Day.  After they left, she was walking back into the house and she stumbled over a step, landing on her left hip.  She has had persistent pain and difficulty with ambulation since but thought it was just a bruise.  No other injuries.  She denies memory issues, although there is report of concern about this in the chart.         ER Course:  [facility] transfer, per Dr. Velia Meyer:   81 year old female who presents as transfer from [place] with acute left hip fracture following a reported mechanical fall earlier in the week, with imaging at [facility] revealing left hip fracture.  Dr. Marlou Sa of Orthopedic surgery has been  consulted, and is planning to take patient to OR today for definitive surgical correction of left hip fracture.  Hospital Course:  #1 left femoral neck fracture/hip fracture status post THA from a anterior approach postop day 2 -Secondary to mechanical fall. -Orthopedics consulted and patient subsequently underwent left total hip replacement 05/21/2021 without any complications. -WBAT with walker per orthopedics. -Aspirin 81 mg twice daily for DVT prophylaxis. -PT/OT recommended SNF which patient will be discharged to. -Patient was followed by orthopedics throughout the hospitalization will follow-up with orthopedics in the outpatient setting 2 weeks post discharge.  2.  Preoperative stratification Orthopedic/spinal surgery is associated with an intermediate (1-5%) cardiovascular risk for cardiac death and nonfatal MI -She has no diagnosed CVD RF (no known h/o MI or CHF) so her revised cardiac index gives a risk estimate of 3.9%, class I risk -It is reasonable for her to go to the OR without additional evaluation.  3.  COPD -Not on home O2. -Patient understands of 90% on room air. -Possible nodule noted on chest x-ray recommending nonemergent CT scan which may be done in the outpatient setting. -It is noted during last hospitalization 05/2019 she was recommended for repeat chest CT in 11/2019.  Most recent CT was 03/2020 with evidence of COPD but no concerning findings. -Patient started on Dulera and Incruse as well as albuterol inhaler as needed which patient be discharged on.   -Tobacco cessation stressed to patient.   4.  Leukocytosis -Likely secondary to reactive leukocytosis. -Patient  with no overt infection. -Leukocytosis improved and had resolved by day of discharge.  5.  Postop acute blood loss anemia -Patient with no overt bleeding. -Anemia panel consistent with anemia of chronic disease. -Vitamin B12 level of 406. -Hemoglobin remained stable at 8.8 by day of discharge.  6.   Hyperlipidemia -Patient maintained on statin.    7.  Chronic hypotension -Patient noted to have been on midodrine in the past and noted to have run out of her medication. -Midodrine resumed during this hospitalization which patient be discharged on.  8.  Anxiety -Patient was maintained on Valium 3 times daily as needed.  9.  Constipation -Patient noted with complaints of constipation.   -Patient was placed on MiraLAX twice daily, Senokot-S twice daily as well as given a Dulcolax suppository with no initial results.   -Patient subsequently given a smog enema with good results by day of discharge.   -Patient will be discharged on a bowel regimen of MiraLAX twice daily as well as Senokot-S twice daily.   10.  Hypophosphatemia -Repleted.   -Phosphorus noted at 3.5 by day of discharge.   11.  CKD stage IIIb -Stable. -Patient noted to have a bump in creatinine to 1.70, placed on IV fluids with improvement with creatinine down to 1.30 by day of discharge.   -Outpatient follow-up.   12.  Tobacco dependence -Tobacco cessation stressed to patient. -Patient placed on a nicotine patch.    13.  Severe malnutrition in the context of chronic illness -Patient noted with severe muscle depletion and severe fat depletion. -Nutritional supplementation. -Nutritionist consulted and followed the patient during the hospitalization.    Procedures: CT head without contrast 05/20/2021 Chest x-ray 05/20/2021, 05/21/2021 Plain films of the left hip and pelvis 05/21/2021 Left displaced femoral neck fracture, total hip replacement, per orthopedics: Dr. Marlou Sa 05/21/2021      Consultations: Orthopedics: Dr. Marlou Sa 05/21/2021  Discharge Exam: Vitals:   05/25/21 0842 05/25/21 1250  BP: (!) 113/42 (!) 113/54  Pulse: 88 73  Resp: 18 18  Temp: 98.3 F (36.8 C) 98 F (36.7 C)  SpO2: 94% 95%    General: NAD Cardiovascular: RRR no murmurs rubs or gallops.  No JVD.  No lower extremity edema. Respiratory:  Clear to auscultation bilaterally.  No wheezes, no crackles, no rhonchi.  Fair air movement.  Speaking in full sentences.  Discharge Instructions   Discharge Instructions     Diet general   Complete by: As directed    Discharge wound care:   Complete by: As directed    Per orthopedics.   Increase activity slowly   Complete by: As directed       Allergies as of 05/25/2021       Reactions   Latex Rash        Medication List     TAKE these medications    acetaminophen 325 MG tablet Commonly known as: TYLENOL Take 2 tablets (650 mg total) by mouth every 6 (six) hours as needed for mild pain (or Fever >/= 101).   albuterol 108 (90 Base) MCG/ACT inhaler Commonly known as: VENTOLIN HFA Inhale 1 puff into the lungs every 4 (four) hours as needed for wheezing or shortness of breath.   aspirin 81 MG chewable tablet Chew 1 tablet (81 mg total) by mouth 2 (two) times daily.   BC HEADACHE POWDER PO Take 1 packet by mouth 2 (two) times daily as needed (pain/headache).   diazepam 5 MG tablet Commonly known as: VALIUM Take 1  tablet (5 mg total) by mouth every 8 (eight) hours as needed for anxiety. What changed: when to take this   feeding supplement Liqd Take 237 mLs by mouth 3 (three) times daily between meals.   midodrine 5 MG tablet Commonly known as: PROAMATINE Take 1 tablet (5 mg total) by mouth 3 (three) times daily.   mometasone-formoterol 200-5 MCG/ACT Aero Commonly known as: DULERA Inhale 2 puffs into the lungs 2 (two) times daily.   multivitamin with minerals Tabs tablet Take 1 tablet by mouth every morning. Centrum   nicotine 14 mg/24hr patch Commonly known as: NICODERM CQ - dosed in mg/24 hours Place 1 patch (14 mg total) onto the skin daily as needed (tobacco dependence).   oxyCODONE 5 MG immediate release tablet Commonly known as: Oxy IR/ROXICODONE Take 1-2 tablets (5-10 mg total) by mouth every 4 (four) hours as needed for breakthrough pain ((for  MODERATE breakthrough pain)).   polyethylene glycol 17 g packet Commonly known as: MIRALAX / GLYCOLAX Take 17 g by mouth 2 (two) times daily. Hold if develops diarrhea or multiple loose stools.   polyvinyl alcohol 1.4 % ophthalmic solution Commonly known as: LIQUIFILM TEARS Place 1 drop into both eyes daily as needed for dry eyes.   rosuvastatin 5 MG tablet Commonly known as: CRESTOR Take 5 mg by mouth every morning.   senna-docusate 8.6-50 MG tablet Commonly known as: Senokot-S Take 1 tablet by mouth 2 (two) times daily.   simethicone 80 MG chewable tablet Commonly known as: MYLICON Chew 2 tablets (160 mg total) by mouth 4 (four) times daily for 5 days.   umeclidinium bromide 62.5 MCG/ACT Aepb Commonly known as: INCRUSE ELLIPTA Inhale 1 puff into the lungs daily. Start taking on: May 26, 2021               Durable Medical Equipment  (From admission, onward)           Start     Ordered   05/24/21 1033  For home use only DME 3 n 1  Once        05/24/21 1032              Discharge Care Instructions  (From admission, onward)           Start     Ordered   05/25/21 0000  Discharge wound care:       Comments: Per orthopedics.   05/25/21 1301           Allergies  Allergen Reactions   Latex Rash    Follow-up Information     Marlou Sa Tonna Corner, MD. Schedule an appointment as soon as possible for a visit in 2 week(s).   Specialty: Orthopedic Surgery Contact information: [place] [address] [phone]         MD AT SNF Follow up.                   The results of significant diagnostics from this hospitalization (including imaging, microbiology, ancillary and laboratory) are listed below for reference.    Significant Diagnostic Studies: CT Head Wo Contrast  Result Date: 05/20/2021 CLINICAL DATA:  Head trauma, moderate-severe EXAM: CT HEAD WITHOUT CONTRAST TECHNIQUE: Contiguous axial images were obtained  from the base of the skull through the vertex without intravenous contrast. RADIATION DOSE REDUCTION: This exam was performed according to the departmental dose-optimization program which includes automated exposure control, adjustment of the mA and/or kV according to patient size and/or use  of iterative reconstruction technique. COMPARISON:  05/18/2019 FINDINGS: Brain: No evidence of acute infarction, hemorrhage, hydrocephalus, extra-axial collection or mass lesion/mass effect. Scattered low-density changes within the periventricular and subcortical white matter compatible with chronic microvascular ischemic change. Mild diffuse cerebral volume loss. Vascular: Atherosclerotic calcifications involving the large vessels of the skull base. No unexpected hyperdense vessel. Skull: Normal. Negative for fracture or focal lesion. Sinuses/Orbits: No acute finding. Other: None. IMPRESSION: 1. No acute intracranial abnormality. 2. Chronic microvascular ischemic change and cerebral volume loss. Electronically Signed   By: Davina Poke D.O.   On: 05/20/2021 18:53   Pelvis Portable  Result Date: 05/21/2021 CLINICAL DATA:  Postop exam after left hip replacement EXAM: PORTABLE PELVIS 1-2 VIEWS COMPARISON:  None Available. FINDINGS: The patient is status post left hip replacement. Acetabular and femoral components are in good position. Postoperative air seen in the soft tissues. IMPRESSION: Left hip replacement as above. Hardware is in good position. Postoperative air seen in the soft tissues. Electronically Signed   By: Dorise Bullion III M.D.   On: 05/21/2021 12:57   DG Chest Port 1 View  Result Date: 05/20/2021 CLINICAL DATA:  Fall. EXAM: PORTABLE CHEST 1 VIEW COMPARISON:  Chest x-ray 05/19/2019.  Chest CT 03/17/2020. FINDINGS: Linear scarring is again seen bilaterally. There is a questionable small focal opacity in the right upper lobe which was not seen on the prior study. This is slightly nodular. There is no  lung consolidation, pleural effusion or pneumothorax. Cardiomediastinal silhouette is within normal limits. No fractures are seen. IMPRESSION: 1. Questionable small/nodular density in the right upper lobe. Recommend follow-up nonemergent chest CT. 2. Stable areas of bilateral scarring. Electronically Signed   By: Ronney Asters M.D.   On: 05/20/2021 18:53   DG C-Arm 1-60 Min-No Report  Result Date: 05/21/2021 Fluoroscopy was utilized by the requesting physician.  No radiographic interpretation.   DG C-Arm 1-60 Min-No Report  Result Date: 05/21/2021 Fluoroscopy was utilized by the requesting physician.  No radiographic interpretation.   DG HIP UNILAT WITH PELVIS 2-3 VIEWS LEFT  Result Date: 05/21/2021 CLINICAL DATA:  S/p LEFT hip arthroplasty. EXAM: DG HIP (WITH OR WITHOUT PELVIS) 3V LEFT COMPARISON:  05/20/2021 FINDINGS: Intraoperative spot films of the LEFT hip are submitted postoperatively for interpretation. LEFT total hip arthroplasty identified without complicating features. IMPRESSION: LEFT total hip arthroplasty without complicating features. Electronically Signed   By: Margarette Canada M.D.   On: 05/21/2021 10:20   DG Hip Unilat With Pelvis 2-3 Views Left  Result Date: 05/20/2021 CLINICAL DATA:  Fall.  Pain in the left hip. EXAM: DG HIP (WITH OR WITHOUT PELVIS) 2-3V LEFT COMPARISON:  None Available. FINDINGS: There is an acute fracture through the subcapital left femoral neck region. There is superolateral displacement of the distal fracture fragment. There is no dislocation. Joint spaces are well maintained. Peripheral vascular calcifications are present. IMPRESSION: 1. Displaced left femoral neck fracture. Electronically Signed   By: Ronney Asters M.D.   On: 05/20/2021 18:27    Microbiology: Recent Results (from the past 240 hour(s))  Surgical pcr screen     Status: Abnormal   Collection Time: 05/21/21  6:57 AM   Specimen: Nasal Mucosa; Nasal Swab  Result Value Ref Range Status   MRSA,  PCR NEGATIVE NEGATIVE Final   Staphylococcus aureus POSITIVE (A) NEGATIVE Final    Comment: (NOTE) The Xpert SA Assay (FDA approved for NASAL specimens in patients 91 years of age and older), is one component of a comprehensive surveillance  program. It is not intended to diagnose infection nor to guide or monitor treatment. Performed at [facility], [place] [address] [place], [place] [address]      Labs: Basic Metabolic Panel: Recent Labs  Lab 05/20/21 1852 05/22/21 0254 05/23/21 0044 05/24/21 0336 05/25/21 0346  NA 136 135 134* 137 137  K 3.6 4.9 4.3 4.0 4.5  CL 103 106 107 109 109  CO2 '23 26 23 24 25  '$ GLUCOSE 84 203* 100* 102* 112*  BUN 27* 22 24* 29* 25*  CREATININE 1.36* 1.40* 1.70* 1.20* 1.30*  CALCIUM 8.7* 8.0* 7.9* 8.6* 8.6*  MG  --   --  2.1 2.1  --   PHOS  --   --  2.3* 2.4* 3.5   Liver Function Tests: Recent Labs  Lab 05/20/21 1852 05/23/21 0044 05/24/21 0336 05/25/21 0346  AST 32 26 29  --   ALT '16 9 7  '$ --   ALKPHOS 61 57 57  --   BILITOT 0.3 0.2* 0.4  --   PROT 6.8 4.7* 5.3*  --   ALBUMIN 3.8 2.3* 2.4* 2.3*   No results for input(s): LIPASE, AMYLASE in the last 168 hours. No results for input(s): AMMONIA in the last 168 hours. CBC: Recent Labs  Lab 05/20/21 1852 05/22/21 0254 05/23/21 0044 05/24/21 0336 05/25/21 0346  WBC 9.7 10.6* 9.3 9.7 7.5  NEUTROABS 7.1  --  7.1 7.2  --   HGB 12.3 9.8* 8.6* 10.0* 8.8*  HCT 38.1 31.1* 25.8* 29.1* 27.4*  MCV 95.7 101.0* 95.9 94.8 99.3  PLT 209 195 198 266 256   Cardiac Enzymes: No results for input(s): CKTOTAL, CKMB, CKMBINDEX, TROPONINI in the last 168 hours. BNP: BNP (last 3 results) No results for input(s): BNP in the last 8760 hours.  ProBNP (last 3 results) No results for input(s): PROBNP in the last 8760 hours.  CBG: No results for input(s): GLUCAP in the last 168 hours.     Signed:  Irine Seal MD.  [facility] 05/25/2021, 1:10 PM

## 2021-05-26 ENCOUNTER — Encounter: Payer: Self-pay | Admitting: Adult Health

## 2021-05-26 ENCOUNTER — Non-Acute Institutional Stay (SKILLED_NURSING_FACILITY): Payer: PPO | Admitting: Adult Health

## 2021-05-26 DIAGNOSIS — S72002S Fracture of unspecified part of neck of left femur, sequela: Secondary | ICD-10-CM

## 2021-05-26 DIAGNOSIS — D62 Acute posthemorrhagic anemia: Secondary | ICD-10-CM | POA: Diagnosis not present

## 2021-05-26 DIAGNOSIS — I9589 Other hypotension: Secondary | ICD-10-CM

## 2021-05-26 DIAGNOSIS — J449 Chronic obstructive pulmonary disease, unspecified: Secondary | ICD-10-CM | POA: Diagnosis not present

## 2021-05-26 DIAGNOSIS — N1832 Chronic kidney disease, stage 3b: Secondary | ICD-10-CM | POA: Diagnosis not present

## 2021-05-26 NOTE — Progress Notes (Signed)
Location:  [place] Room Number: SH108/A Place of Service:  SNF (31) Provider:  Durenda Age, DNP, FNP-BC  Patient Care Team: Brand Males, MD as PCP - General (Pulmonary Disease)  Extended Emergency Contact Information Primary Emergency Contact: [place] Mobile Phone: [phone] Relation: Son Secondary Emergency Contact: [place] Mobile Phone: [phone] Relation: Daughter  Code Status:  full  Goals of care: Advanced Directive information    05/26/2021   10:59 AM  Advanced Directives  Does Patient Have a Medical Advance Directive? No  Would patient like information on creating a medical advance directive? No - Patient declined     Chief Complaint  Patient presents with   Hospitalization Follow-up    Follow up hospitalization 05/20/21 to 05/25/21     HPI:  Pt is a 81 y.o. female who was admitted to [facility] on  05/25/21 post hospital admission 05/20/2021 to 05/25/2021.  She has a PMH of COPD, remote uterine cancer and hyperlipidemia.  She presented to the [facility] post fall at home, stumbled over a step, landing on her left hip.  Imaging showed fluid left hip fracture.  She was transferred to [facility]. Orthopedic surgery was consulted and performed left total hip replacement on 05/21/2021.  She was admitted to [facility] [facility] for short-term rehabilitation.  Past Medical History:  Diagnosis Date   COPD (chronic obstructive pulmonary disease) ([place])    High cholesterol    Uterine cancer ([place]) 1970   Past Surgical History:  Procedure Laterality Date   ABDOMINAL HYSTERECTOMY     TOTAL HIP ARTHROPLASTY Left 05/21/2021   Procedure: TOTAL HIP ARTHROPLASTY ANTERIOR APPROACH;  Surgeon: Meredith Pel, MD;  Location: [place];  Service: Orthopedics;  Laterality: Left;    Allergies  Allergen Reactions   Latex Rash    Outpatient Encounter Medications as of 05/26/2021   Medication Sig   acetaminophen (TYLENOL) 325 MG tablet Take 2 tablets (650 mg total) by mouth every 6 (six) hours as needed for mild pain (or Fever >/= 101).   albuterol (VENTOLIN HFA) 108 (90 Base) MCG/ACT inhaler Inhale 1 puff into the lungs every 4 (four) hours as needed for wheezing or shortness of breath.   aspirin 81 MG chewable tablet Chew 1 tablet (81 mg total) by mouth 2 (two) times daily.   Aspirin-Salicylamide-Caffeine (BC HEADACHE POWDER PO) Take 1 packet by mouth 2 (two) times daily as needed (pain/headache).   diazepam (VALIUM) 5 MG tablet Take 1 tablet (5 mg total) by mouth every 8 (eight) hours as needed for anxiety.   feeding supplement (ENSURE ENLIVE / ENSURE PLUS) LIQD Take 237 mLs by mouth 3 (three) times daily between meals.   midodrine (PROAMATINE) 5 MG tablet Take 1 tablet (5 mg total) by mouth 3 (three) times daily.   mometasone-formoterol (DULERA) 200-5 MCG/ACT AERO Inhale 2 puffs into the lungs 2 (two) times daily.   Multiple Vitamin (MULTIVITAMIN WITH MINERALS) TABS tablet Take 1 tablet by mouth every morning. Centrum   nicotine (NICODERM CQ - DOSED IN MG/24 HOURS) 14 mg/24hr patch Place 1 patch (14 mg total) onto the skin daily as needed (tobacco dependence).   oxyCODONE (OXY IR/ROXICODONE) 5 MG immediate release tablet Take 1-2 tablets (5-10 mg total) by mouth every 4 (four) hours as needed for breakthrough pain ((for MODERATE breakthrough pain)).   polyethylene glycol (MIRALAX / GLYCOLAX) 17 g packet Take 17 g by mouth 2 (two) times daily. Hold if develops diarrhea or multiple loose stools.  polyvinyl alcohol (LIQUIFILM TEARS) 1.4 % ophthalmic solution Place 1 drop into both eyes daily as needed for dry eyes.   rosuvastatin (CRESTOR) 5 MG tablet Take 5 mg by mouth every morning.   senna-docusate (SENOKOT-S) 8.6-50 MG tablet Take 1 tablet by mouth 2 (two) times daily.   simethicone (MYLICON) 80 MG chewable tablet Chew 2 tablets (160 mg total) by mouth 4 (four) times  daily for 5 days.   umeclidinium bromide (INCRUSE ELLIPTA) 62.5 MCG/ACT AEPB Inhale 1 puff into the lungs daily.   No facility-administered encounter medications on file as of 05/26/2021.    Review of Systems  Constitutional:  Negative for appetite change, chills, fatigue and fever.  HENT:  Negative for congestion, hearing loss, rhinorrhea and sore throat.   Eyes: Negative.   Respiratory:  Negative for cough, shortness of breath and wheezing.   Cardiovascular:  Negative for chest pain, palpitations and leg swelling.  Gastrointestinal:  Negative for abdominal pain, constipation, diarrhea, nausea and vomiting.  Genitourinary:  Negative for dysuria.  Musculoskeletal:  Negative for arthralgias, back pain and myalgias.  Skin:  Negative for color change, rash and wound.  Neurological:  Negative for dizziness, weakness and headaches.  Psychiatric/Behavioral:  Negative for behavioral problems. The patient is not nervous/anxious.        There is no immunization history on file for this patient. Pertinent  Health Maintenance Due  Topic Date Due   INFLUENZA VACCINE  08/01/2021   DEXA SCAN  Completed      05/23/2021    8:00 AM 05/23/2021    8:25 PM 05/24/2021    7:27 AM 05/24/2021    7:35 PM 05/25/2021    8:00 AM  Fall Risk  Patient Fall Risk Level High fall risk High fall risk High fall risk High fall risk High fall risk     Vitals:   05/26/21 1101  BP: (!) 97/50  Pulse: 80  Resp: 18  Temp: 97.7 F (36.5 C)  Weight: 98 lb 1.8 oz (44.5 kg)  Height: 5' (1.524 m)   Body mass index is 19.16 kg/m.  Physical Exam Constitutional:      General: She is not in acute distress. HENT:     Head: Normocephalic and atraumatic.     Nose: Nose normal.     Mouth/Throat:     Mouth: Mucous membranes are moist.  Eyes:     Conjunctiva/sclera: Conjunctivae normal.  Cardiovascular:     Rate and Rhythm: Normal rate and regular rhythm.  Pulmonary:     Effort: Pulmonary effort is normal.      Breath sounds: Normal breath sounds.  Abdominal:     General: Bowel sounds are normal.     Palpations: Abdomen is soft.  Musculoskeletal:        General: Normal range of motion.     Cervical back: Normal range of motion.     Comments: LLE 1-2+edema  Skin:    General: Skin is warm and dry.     Comments: Left hip surgical wound with aquacel dressing.  Neurological:     General: No focal deficit present.     Mental Status: She is alert and oriented to person, place, and time.  Psychiatric:        Mood and Affect: Mood normal.        Behavior: Behavior normal.        Thought Content: Thought content normal.        Judgment: Judgment normal.  Labs reviewed: Recent Labs    05/23/21 0044 05/24/21 0336 05/25/21 0346  NA 134* 137 137  K 4.3 4.0 4.5  CL 107 109 109  CO2 '23 24 25  '$ GLUCOSE 100* 102* 112*  BUN 24* 29* 25*  CREATININE 1.70* 1.20* 1.30*  CALCIUM 7.9* 8.6* 8.6*  MG 2.1 2.1  --   PHOS 2.3* 2.4* 3.5   Recent Labs    05/20/21 1852 05/23/21 0044 05/24/21 0336 05/25/21 0346  AST 32 26 29  --   ALT '16 9 7  '$ --   ALKPHOS 61 57 57  --   BILITOT 0.3 0.2* 0.4  --   PROT 6.8 4.7* 5.3*  --   ALBUMIN 3.8 2.3* 2.4* 2.3*   Recent Labs    05/20/21 1852 05/22/21 0254 05/23/21 0044 05/24/21 0336 05/25/21 0346  WBC 9.7   < > 9.3 9.7 7.5  NEUTROABS 7.1  --  7.1 7.2  --   HGB 12.3   < > 8.6* 10.0* 8.8*  HCT 38.1   < > 25.8* 29.1* 27.4*  MCV 95.7   < > 95.9 94.8 99.3  PLT 209   < > 198 266 256   < > = values in this interval not displayed.   Lab Results  Component Value Date   TSH 2.886 05/18/2019   Lab Results  Component Value Date   HGBA1C 5.1 05/18/2019   Lab Results  Component Value Date   CHOL 157 05/18/2019   HDL 57 05/18/2019   LDLCALC 85 05/18/2019   TRIG 76 05/18/2019   CHOLHDL 2.8 05/18/2019    Significant Diagnostic Results in last 30 days:  CT Head Wo Contrast  Result Date: 05/20/2021 CLINICAL DATA:  Head trauma, moderate-severe EXAM:  CT HEAD WITHOUT CONTRAST TECHNIQUE: Contiguous axial images were obtained from the base of the skull through the vertex without intravenous contrast. RADIATION DOSE REDUCTION: This exam was performed according to the departmental dose-optimization program which includes automated exposure control, adjustment of the mA and/or kV according to patient size and/or use of iterative reconstruction technique. COMPARISON:  05/18/2019 FINDINGS: Brain: No evidence of acute infarction, hemorrhage, hydrocephalus, extra-axial collection or mass lesion/mass effect. Scattered low-density changes within the periventricular and subcortical white matter compatible with chronic microvascular ischemic change. Mild diffuse cerebral volume loss. Vascular: Atherosclerotic calcifications involving the large vessels of the skull base. No unexpected hyperdense vessel. Skull: Normal. Negative for fracture or focal lesion. Sinuses/Orbits: No acute finding. Other: None. IMPRESSION: 1. No acute intracranial abnormality. 2. Chronic microvascular ischemic change and cerebral volume loss. Electronically Signed   By: Davina Poke D.O.   On: 05/20/2021 18:53   Pelvis Portable  Result Date: 05/21/2021 CLINICAL DATA:  Postop exam after left hip replacement EXAM: PORTABLE PELVIS 1-2 VIEWS COMPARISON:  None Available. FINDINGS: The patient is status post left hip replacement. Acetabular and femoral components are in good position. Postoperative air seen in the soft tissues. IMPRESSION: Left hip replacement as above. Hardware is in good position. Postoperative air seen in the soft tissues. Electronically Signed   By: Dorise Bullion III M.D.   On: 05/21/2021 12:57   DG Chest Port 1 View  Result Date: 05/20/2021 CLINICAL DATA:  Fall. EXAM: PORTABLE CHEST 1 VIEW COMPARISON:  Chest x-ray 05/19/2019.  Chest CT 03/17/2020. FINDINGS: Linear scarring is again seen bilaterally. There is a questionable small focal opacity in the right upper lobe which  was not seen on the prior study. This is slightly nodular. There is  no lung consolidation, pleural effusion or pneumothorax. Cardiomediastinal silhouette is within normal limits. No fractures are seen. IMPRESSION: 1. Questionable small/nodular density in the right upper lobe. Recommend follow-up nonemergent chest CT. 2. Stable areas of bilateral scarring. Electronically Signed   By: Ronney Asters M.D.   On: 05/20/2021 18:53   DG C-Arm 1-60 Min-No Report  Result Date: 05/21/2021 Fluoroscopy was utilized by the requesting physician.  No radiographic interpretation.   DG C-Arm 1-60 Min-No Report  Result Date: 05/21/2021 Fluoroscopy was utilized by the requesting physician.  No radiographic interpretation.   DG HIP UNILAT WITH PELVIS 2-3 VIEWS LEFT  Result Date: 05/21/2021 CLINICAL DATA:  S/p LEFT hip arthroplasty. EXAM: DG HIP (WITH OR WITHOUT PELVIS) 3V LEFT COMPARISON:  05/20/2021 FINDINGS: Intraoperative spot films of the LEFT hip are submitted postoperatively for interpretation. LEFT total hip arthroplasty identified without complicating features. IMPRESSION: LEFT total hip arthroplasty without complicating features. Electronically Signed   By: Margarette Canada M.D.   On: 05/21/2021 10:20   DG Hip Unilat With Pelvis 2-3 Views Left  Result Date: 05/20/2021 CLINICAL DATA:  Fall.  Pain in the left hip. EXAM: DG HIP (WITH OR WITHOUT PELVIS) 2-3V LEFT COMPARISON:  None Available. FINDINGS: There is an acute fracture through the subcapital left femoral neck region. There is superolateral displacement of the distal fracture fragment. There is no dislocation. Joint spaces are well maintained. Peripheral vascular calcifications are present. IMPRESSION: 1. Displaced left femoral neck fracture. Electronically Signed   By: Ronney Asters M.D.   On: 05/20/2021 18:27    Assessment/Plan  1. Closed fracture of left hip, sequela  -   S/P left total hip replacement on 05/21/2021 -    WBAT with walker -   Continue  aspirin 81 mg twice daily for DVT prophylaxis -   For PT and OT, for therapeutic strengthening exercises -    Continue oxycodone IR 5 mg 1 tab every 6 hours PRN for pain -     Follow-up with orthopedics in 2 weeks  2. COPD not affecting current episode of care [facility]) -  no wheezing, continue Dulera, Incruse and PRN albuterol  3. Hypotension, chronic -   Continue midodrine 5 mg 3 times daily  4. Chronic kidney disease, stage 3b [facility]) Lab Results  Component Value Date   NA 137 05/25/2021   K 4.5 05/25/2021   CO2 25 05/25/2021   GLUCOSE 112 (H) 05/25/2021   BUN 25 (H) 05/25/2021   CREATININE 1.30 (H) 05/25/2021   CALCIUM 8.6 (L) 05/25/2021   GFRNONAA 42 (L) 05/25/2021   -   will monitor  5. Acute blood loss anemia Lab Results  Component Value Date   WBC 7.5 05/25/2021   HGB 8.8 (L) 05/25/2021   HCT 27.4 (L) 05/25/2021   MCV 99.3 05/25/2021   PLT 256 05/25/2021   -  will monitor    Family/ staff Communication: Discussed plan of care with resident and charge nurse.  Labs/tests ordered:   None    Durenda Age, DNP, MSN, FNP-BC [facility] [phone] (Monday-Friday 8:00 a.m. - 5:00 p.m.) [phone] (after hours)

## 2021-05-30 ENCOUNTER — Non-Acute Institutional Stay (SKILLED_NURSING_FACILITY): Payer: PPO | Admitting: Internal Medicine

## 2021-05-30 ENCOUNTER — Encounter: Payer: Self-pay | Admitting: Internal Medicine

## 2021-05-30 DIAGNOSIS — N1832 Chronic kidney disease, stage 3b: Secondary | ICD-10-CM

## 2021-05-30 DIAGNOSIS — R29818 Other symptoms and signs involving the nervous system: Secondary | ICD-10-CM

## 2021-05-30 DIAGNOSIS — I9589 Other hypotension: Secondary | ICD-10-CM | POA: Diagnosis not present

## 2021-05-30 DIAGNOSIS — R911 Solitary pulmonary nodule: Secondary | ICD-10-CM | POA: Diagnosis not present

## 2021-05-30 DIAGNOSIS — R4189 Other symptoms and signs involving cognitive functions and awareness: Secondary | ICD-10-CM

## 2021-05-30 DIAGNOSIS — S72002A Fracture of unspecified part of neck of left femur, initial encounter for closed fracture: Secondary | ICD-10-CM

## 2021-05-30 DIAGNOSIS — Z720 Tobacco use: Secondary | ICD-10-CM

## 2021-05-30 DIAGNOSIS — E43 Unspecified severe protein-calorie malnutrition: Secondary | ICD-10-CM | POA: Diagnosis not present

## 2021-05-30 NOTE — Assessment & Plan Note (Addendum)
Midodrine reinitiated while hospitalized for hip fracture surgery.  She had let the prescription lapse PTA.

## 2021-05-30 NOTE — Progress Notes (Signed)
NURSING HOME LOCATION:  [facility] ROOM NUMBER:  [id number]  CODE STATUS:  Full Code  PCP:  Brand Males MD  This is a comprehensive admission note to this SNFperformed on this date less than 30 days from date of admission. Included are preadmission medical/surgical history; reconciled medication list; family history; social history and comprehensive review of systems.  Corrections and additions to the records were documented. Comprehensive physical exam was also performed. Additionally a clinical summary was entered for each active diagnosis pertinent to this admission in the Problem List to enhance continuity of care.  HPI: She was hospitalized 5/28 - 05/25/2021 for displaced fracture of the left femoral neck sustained in a mechanical fall on 5/14.  There was no cardiac or neurologic prodrome prior to the fall.  Over the ensuing 6 days she had persistent pain and difficulty ambulating; but she thought she had just bruised her hip.  Imaging in the ED revealed the left femoral neck fracture.Left total hip arthroplasty was completed 05/21/2021 by Dr. Alphonzo Severance. Postop WBAT with a walker was recommended with aspirin 81 mg twice daily for DVT prophylaxis. Symptom finding on preop imaging was a possible nodule.  A CT of the chest in March 2022 had revealed COPD without suspicious nodule. Dulera and Incruse were initiated along with albuterol as needed as pulmonary toilet.  Smoking cessation was emphasized.  Nicotine patch was prescribed. Admission H/H was 12.3/38.1; with expected blood loss anemia final H/H was 8.8/27.4.  She did have a slight AKI with a peak creatinine 1.70 and nadir GFR of 30 but with with fluids final creatinine was 1.30 and GFR 42 indicating CKD stage IIIb. Minimal leukocytosis was attributed to the stress of the fracture and surgery; there were no signs or symptoms of active infection. She does have chronic hypotension but had not been compliant with her  midodrine as an outpatient.  This was renewed. Severe protein/caloric malnutrition was present with support of this diagnosis by severe muscle depletion and total protein of 5.3 and albumin of 2.3. PT/OT recommended SNF placement for rehab.  Past medical and surgical history includes COPD, dyslipidemia, chronic hypotension, anxiety, and history of uterine cancer.  Recent procedures include abdominal hysterectomy.  Social history: Nondrinker; today smoker with a 65-pack-year history.  Family history: Noncontributory due to advanced age.   Review of systems: She was able to provide a history despite a BIMS score of 8 which is almost severe dementia.  PT/OT states that she is ambulating with minimal assist walking 40 feet albeit slowly. Patient concern at this time is bilateral lower extremity edema.  She describes gas-like discomfort intra-abdominal he for which Gas-X has been beneficial.  She describes ongoing constipation. She describes restless sleep but denies apnea or PND. She describes occasional tingling in her fingers.  She also has rare dysphagia.  Constitutional: No fever, significant weight change, fatigue  Eyes: No redness, discharge, pain, vision change ENT/mouth: No nasal congestion, purulent discharge, earache, change in hearing, sore throat  Cardiovascular: No chest pain, palpitations, paroxysmal nocturnal dyspnea, claudication, edema  Respiratory: No cough, sputum production, hemoptysis, DOE, significant snoring, apnea Gastrointestinal: No heartburn, dysphagia, abdominal pain, nausea /vomiting, rectal bleeding, melena, change in bowels Genitourinary: No dysuria, hematuria, pyuria, incontinence, nocturia Musculoskeletal: No joint stiffness, joint swelling, weakness, pain Dermatologic: No rash, pruritus, change in appearance of skin Neurologic: No dizziness, headache, syncope, seizures, numbness, tingling Psychiatric: No significant anxiety, depression, insomnia,  anorexia Endocrine: No change in hair/skin/nails, excessive thirst, excessive  hunger, excessive urination  Hematologic/lymphatic: No significant bruising, lymphadenopathy, abnormal bleeding Allergy/immunology: No itchy/watery eyes, significant sneezing, urticaria, angioedema  Physical exam:  Pertinent or positive findings: She appears chronically ill and her age.  Hair is disheveled.  Facies are wrinkled.  Lids are puffy, especially the lower lids.  She has an upper plate.  Dentition is stained.  The first and second heart sounds are accentuated but most strikingly the first heart sound at the lower left sternal border.  She has low-grade dry rales anteriorly.  Abdomen is protuberant.  There is 1/2+ edema at this over the lower extremities.  Posterior tibial pulses are stronger than the dorsalis pedis pulses.  Limb atrophy is present.  She has marked interosseous wasting of the hands.  General appearance: no acute distress, increased work of breathing is present.   Lymphatic: No lymphadenopathy about the head, neck, axilla. Eyes: No conjunctival inflammation or lid edema is present. There is no scleral icterus. Ears:  External ear exam shows no significant lesions or deformities.   Nose:  External nasal examination shows no deformity or inflammation. Nasal mucosa are pink and moist without lesions, exudates Oral exam: Lips and gums are healthy appearing.There is no oropharyngeal erythema or exudate. Neck:  No thyromegaly, masses, tenderness noted.    Heart:  Normal rate and regular rhythm. S1 and S2 normal without gallop, murmur, click, rub.  Lungs: Chest clear to auscultation without wheezes, rhonchi, rales, rubs. Abdomen: Bowel sounds are normal.  Abdomen is soft and nontender with no organomegaly, hernias, masses. GU: Deferred  Extremities:  No cyanosis, clubbing, edema. Neurologic exam:  Strength equal  in upper & lower extremities. Balance, Rhomberg, finger to nose testing could not be  completed due to clinical state Deep tendon reflexes are equal Skin: Warm & dry w/o tenting. No significant lesions or rash.  See clinical summary under each active problem in the Problem List with associated updated therapeutic plan

## 2021-05-30 NOTE — Patient Instructions (Signed)
See assessment and plan under each diagnosis in the problem list and acutely for this visit 

## 2021-05-30 NOTE — Assessment & Plan Note (Signed)
While hospitalized for hip fracture; slight AKI present with peak creatinine 1.70 and nadir GFR of 30.  With fluid resuscitation, creatinine 1.30 and GFR 42 at discharge indicating CKD stage IIIb.  Med list reviewed; no nephrotoxic agents identified.

## 2021-05-30 NOTE — Assessment & Plan Note (Signed)
PT/OT at SNF as tolerated. Orthopedic follow-up with Dr. Marlou Sa 06/21/2021.

## 2021-05-30 NOTE — Assessment & Plan Note (Signed)
Albumin 2.3 and total protein of 5.3 with associated limb atrophy and muscle wastage.  Nutritionist to consult at SNF.

## 2021-05-31 DIAGNOSIS — R911 Solitary pulmonary nodule: Secondary | ICD-10-CM | POA: Insufficient documentation

## 2021-05-31 DIAGNOSIS — R29818 Other symptoms and signs involving the nervous system: Secondary | ICD-10-CM | POA: Insufficient documentation

## 2021-05-31 NOTE — Assessment & Plan Note (Signed)
MMSE such as SLUMS or [place] assessment post discharge from rehab & back in home environment.

## 2021-05-31 NOTE — Assessment & Plan Note (Signed)
F/U as per Dr Chase Caller.

## 2021-06-13 DIAGNOSIS — M6259 Muscle wasting and atrophy, not elsewhere classified, multiple sites: Secondary | ICD-10-CM | POA: Diagnosis not present

## 2021-06-13 DIAGNOSIS — Z741 Need for assistance with personal care: Secondary | ICD-10-CM | POA: Diagnosis not present

## 2021-06-13 DIAGNOSIS — S72002S Fracture of unspecified part of neck of left femur, sequela: Secondary | ICD-10-CM | POA: Diagnosis not present

## 2021-06-13 DIAGNOSIS — R2681 Unsteadiness on feet: Secondary | ICD-10-CM | POA: Diagnosis not present

## 2021-06-13 DIAGNOSIS — M6281 Muscle weakness (generalized): Secondary | ICD-10-CM | POA: Diagnosis not present

## 2021-06-21 ENCOUNTER — Ambulatory Visit (INDEPENDENT_AMBULATORY_CARE_PROVIDER_SITE_OTHER): Payer: PPO | Admitting: Surgical

## 2021-06-21 ENCOUNTER — Encounter: Payer: Self-pay | Admitting: Orthopedic Surgery

## 2021-06-21 ENCOUNTER — Ambulatory Visit (INDEPENDENT_AMBULATORY_CARE_PROVIDER_SITE_OTHER): Payer: PPO

## 2021-06-21 DIAGNOSIS — Z96642 Presence of left artificial hip joint: Secondary | ICD-10-CM

## 2021-06-21 DIAGNOSIS — Z741 Need for assistance with personal care: Secondary | ICD-10-CM | POA: Diagnosis not present

## 2021-06-21 DIAGNOSIS — M6259 Muscle wasting and atrophy, not elsewhere classified, multiple sites: Secondary | ICD-10-CM | POA: Diagnosis not present

## 2021-06-21 DIAGNOSIS — M6281 Muscle weakness (generalized): Secondary | ICD-10-CM | POA: Diagnosis not present

## 2021-06-21 DIAGNOSIS — R2681 Unsteadiness on feet: Secondary | ICD-10-CM | POA: Diagnosis not present

## 2021-06-21 NOTE — Progress Notes (Signed)
Post-Op Visit Note   Patient: Sarah Rojas           Date of Birth: Feb 14, 1940           MRN: [id number] Visit Date: 06/21/2021 PCP: Brand Males, MD   Assessment & Plan:  Chief Complaint:  Chief Complaint  Patient presents with   Left Hip - Routine Post Op    05/21/21 left THA   Visit Diagnoses:  1. S/P hip replacement, left     Plan: Patient is a 81 year old female who presents s/p left total hip arthroplasty on 05/21/2021 that was performed for fracture.  She is doing well overall.  She is at skilled nursing facility receiving physical therapy daily.  She is walking with a walker but feels like she could wean off to a cane at this point.  She denies any chest pain or shortness of breath but does note some calf pain on the left side that is not present on the right.  She is taking pain medication once daily in the morning.  Taking aspirin for DVT prophylaxis.  She is walking several 100 feet at skilled nursing facility.  She lives at home by herself with a son who lives 20 minutes away and a daughter who lives just next door.  On exam, patient has incision that is healing well without any significant evidence of infection or dehiscence.  Dressing removed and Steri-Strips reapplied.  No significant pain with hip range of motion.  She has active hip flexion intact.  Active ankle dorsiflexion intact.  Leg lengths appear equal.  No calf tenderness on the right but positive calf tenderness on the left with increased swelling compared to the right leg.  Plan is set patient up for ultrasound to rule out DVT of the left lower extremity.  Radiographs reviewed with her today demonstrating left total hip prosthesis in good position and alignment without any complicating features.  She will continue with therapy exercises at home and follow-up with the office in 4 weeks for clinical recheck.  Also discussed with her today that as she is walking several 100 feet with physical therapy and has  excellent social support, she should be okay to discharge home from orthopedic standpoint.  Follow-Up Instructions: No follow-ups on file.   Orders:  Orders Placed This Encounter  Procedures   XR HIP UNILAT W OR W/O PELVIS 2-3 VIEWS LEFT   VAS [place] LOWER EXTREMITY VENOUS (DVT)   No orders of the defined types were placed in this encounter.   Imaging: No results found.  PMFS History: Patient Active Problem List   Diagnosis Date Noted   Neurocognitive deficits 05/31/2021   Incidental pulmonary nodule 05/31/2021   Constipation    Closed fracture of left hip (HCC)    Protein-calorie malnutrition, severe 05/22/2021   COPD not affecting current episode of care ([place]) 05/21/2021   Dyslipidemia 05/21/2021   Hypotension, chronic 05/21/2021   Anxiety disorder 05/21/2021   Chronic kidney disease, stage 3b ([place]) 05/21/2021   Displaced fracture of left femoral neck ([place]) 05/20/2021   Hypoglycemia 05/18/2019   Tobacco abuse 05/18/2019   Past Medical History:  Diagnosis Date   COPD (chronic obstructive pulmonary disease) (HCC)    High cholesterol    Uterine cancer ([place]) 1970    Family History  Problem Relation Age of Onset   Diabetes Mellitus II Daughter     Past Surgical History:  Procedure Laterality Date   ABDOMINAL HYSTERECTOMY     TOTAL HIP  ARTHROPLASTY Left 05/21/2021   Procedure: TOTAL HIP ARTHROPLASTY ANTERIOR APPROACH;  Surgeon: Meredith Pel, MD;  Location: [facility];  Service: Orthopedics;  Laterality: Left;   Social History   Occupational History   Occupation: retired  Tobacco Use   Smoking status: Every Day    Packs/day: 1.00    Years: 65.00    Total pack years: 65.00    Types: Cigarettes   Smokeless tobacco: Never  Vaping Use   Vaping Use: Never used  Substance and Sexual Activity   Alcohol use: Never   Drug use: Never   Sexual activity: Not on file

## 2021-06-22 ENCOUNTER — Telehealth: Payer: Self-pay

## 2021-06-22 ENCOUNTER — Ambulatory Visit (HOSPITAL_COMMUNITY)
Admission: RE | Admit: 2021-06-22 | Discharge: 2021-06-22 | Disposition: A | Discharge status: Home / Self Care | Payer: PPO | Source: Ambulatory Visit | Attending: Surgical | Admitting: Surgical

## 2021-06-22 DIAGNOSIS — Z96642 Presence of left artificial hip joint: Secondary | ICD-10-CM | POA: Diagnosis not present

## 2021-06-22 NOTE — Telephone Encounter (Signed)
Sarah Rojas with [facility] wanted to let you know that patient is Negative for DVT, LE.  Please advise.  Thank you.

## 2021-06-23 DIAGNOSIS — M6281 Muscle weakness (generalized): Secondary | ICD-10-CM | POA: Diagnosis not present

## 2021-06-23 DIAGNOSIS — Z4789 Encounter for other orthopedic aftercare: Secondary | ICD-10-CM | POA: Diagnosis not present

## 2021-06-30 DIAGNOSIS — I951 Orthostatic hypotension: Secondary | ICD-10-CM | POA: Diagnosis not present

## 2021-06-30 DIAGNOSIS — R55 Syncope and collapse: Secondary | ICD-10-CM | POA: Diagnosis not present

## 2021-06-30 DIAGNOSIS — E782 Mixed hyperlipidemia: Secondary | ICD-10-CM | POA: Diagnosis not present

## 2021-06-30 DIAGNOSIS — Z79899 Other long term (current) drug therapy: Secondary | ICD-10-CM | POA: Diagnosis not present

## 2021-06-30 DIAGNOSIS — E440 Moderate protein-calorie malnutrition: Secondary | ICD-10-CM | POA: Diagnosis not present

## 2021-06-30 DIAGNOSIS — D649 Anemia, unspecified: Secondary | ICD-10-CM | POA: Diagnosis not present

## 2021-06-30 DIAGNOSIS — J439 Emphysema, unspecified: Secondary | ICD-10-CM | POA: Diagnosis not present

## 2021-10-04 DIAGNOSIS — H04123 Dry eye syndrome of bilateral lacrimal glands: Secondary | ICD-10-CM | POA: Diagnosis not present

## 2021-10-04 DIAGNOSIS — Z961 Presence of intraocular lens: Secondary | ICD-10-CM | POA: Diagnosis not present

## 2021-10-04 DIAGNOSIS — H353131 Nonexudative age-related macular degeneration, bilateral, early dry stage: Secondary | ICD-10-CM | POA: Diagnosis not present

## 2021-10-20 DIAGNOSIS — R5383 Other fatigue: Secondary | ICD-10-CM | POA: Diagnosis not present

## 2021-10-20 DIAGNOSIS — E782 Mixed hyperlipidemia: Secondary | ICD-10-CM | POA: Diagnosis not present

## 2021-10-20 DIAGNOSIS — D649 Anemia, unspecified: Secondary | ICD-10-CM | POA: Diagnosis not present

## 2021-10-20 DIAGNOSIS — J439 Emphysema, unspecified: Secondary | ICD-10-CM | POA: Diagnosis not present

## 2021-10-20 DIAGNOSIS — Z79899 Other long term (current) drug therapy: Secondary | ICD-10-CM | POA: Diagnosis not present

## 2021-10-20 DIAGNOSIS — I951 Orthostatic hypotension: Secondary | ICD-10-CM | POA: Diagnosis not present

## 2021-10-20 DIAGNOSIS — R55 Syncope and collapse: Secondary | ICD-10-CM | POA: Diagnosis not present

## 2021-10-20 DIAGNOSIS — E440 Moderate protein-calorie malnutrition: Secondary | ICD-10-CM | POA: Diagnosis not present

## 2021-11-22 DIAGNOSIS — N3281 Overactive bladder: Secondary | ICD-10-CM | POA: Diagnosis not present

## 2021-11-22 DIAGNOSIS — J432 Centrilobular emphysema: Secondary | ICD-10-CM | POA: Diagnosis not present

## 2021-11-22 DIAGNOSIS — I700 Atherosclerosis of aorta: Secondary | ICD-10-CM | POA: Diagnosis not present

## 2021-11-22 DIAGNOSIS — I951 Orthostatic hypotension: Secondary | ICD-10-CM | POA: Diagnosis not present

## 2021-11-22 DIAGNOSIS — F411 Generalized anxiety disorder: Secondary | ICD-10-CM | POA: Diagnosis not present

## 2021-11-22 DIAGNOSIS — M792 Neuralgia and neuritis, unspecified: Secondary | ICD-10-CM | POA: Diagnosis not present

## 2021-11-22 DIAGNOSIS — E782 Mixed hyperlipidemia: Secondary | ICD-10-CM | POA: Diagnosis not present

## 2021-11-22 DIAGNOSIS — F17200 Nicotine dependence, unspecified, uncomplicated: Secondary | ICD-10-CM | POA: Diagnosis not present

## 2021-11-22 DIAGNOSIS — Z0000 Encounter for general adult medical examination without abnormal findings: Secondary | ICD-10-CM | POA: Diagnosis not present

## 2021-11-22 DIAGNOSIS — R918 Other nonspecific abnormal finding of lung field: Secondary | ICD-10-CM | POA: Diagnosis not present

## 2021-11-22 DIAGNOSIS — M26609 Unspecified temporomandibular joint disorder, unspecified side: Secondary | ICD-10-CM | POA: Diagnosis not present

## 2022-01-19 ENCOUNTER — Ambulatory Visit: Payer: PPO | Admitting: Orthopedic Surgery

## 2022-04-18 DIAGNOSIS — N3946 Mixed incontinence: Secondary | ICD-10-CM | POA: Diagnosis not present

## 2022-04-18 DIAGNOSIS — R350 Frequency of micturition: Secondary | ICD-10-CM | POA: Diagnosis not present

## 2022-04-18 DIAGNOSIS — R351 Nocturia: Secondary | ICD-10-CM | POA: Diagnosis not present

## 2022-06-04 DIAGNOSIS — F17200 Nicotine dependence, unspecified, uncomplicated: Secondary | ICD-10-CM | POA: Diagnosis not present

## 2022-06-04 DIAGNOSIS — R918 Other nonspecific abnormal finding of lung field: Secondary | ICD-10-CM | POA: Diagnosis not present

## 2022-06-04 DIAGNOSIS — I951 Orthostatic hypotension: Secondary | ICD-10-CM | POA: Diagnosis not present

## 2022-06-04 DIAGNOSIS — E782 Mixed hyperlipidemia: Secondary | ICD-10-CM | POA: Diagnosis not present

## 2022-06-04 DIAGNOSIS — I700 Atherosclerosis of aorta: Secondary | ICD-10-CM | POA: Diagnosis not present

## 2022-06-04 DIAGNOSIS — J432 Centrilobular emphysema: Secondary | ICD-10-CM | POA: Diagnosis not present

## 2022-06-04 DIAGNOSIS — F411 Generalized anxiety disorder: Secondary | ICD-10-CM | POA: Diagnosis not present

## 2022-06-08 DIAGNOSIS — I700 Atherosclerosis of aorta: Secondary | ICD-10-CM | POA: Diagnosis not present

## 2022-06-08 DIAGNOSIS — R1084 Generalized abdominal pain: Secondary | ICD-10-CM | POA: Diagnosis not present

## 2022-06-08 DIAGNOSIS — F411 Generalized anxiety disorder: Secondary | ICD-10-CM | POA: Diagnosis not present

## 2022-06-08 DIAGNOSIS — F17200 Nicotine dependence, unspecified, uncomplicated: Secondary | ICD-10-CM | POA: Diagnosis not present

## 2022-06-08 DIAGNOSIS — I951 Orthostatic hypotension: Secondary | ICD-10-CM | POA: Diagnosis not present

## 2022-06-08 DIAGNOSIS — J432 Centrilobular emphysema: Secondary | ICD-10-CM | POA: Diagnosis not present

## 2022-06-08 DIAGNOSIS — E782 Mixed hyperlipidemia: Secondary | ICD-10-CM | POA: Diagnosis not present

## 2022-06-27 ENCOUNTER — Other Ambulatory Visit (INDEPENDENT_AMBULATORY_CARE_PROVIDER_SITE_OTHER): Payer: PPO

## 2022-06-27 ENCOUNTER — Ambulatory Visit: Payer: PPO | Admitting: Surgical

## 2022-06-27 DIAGNOSIS — M5442 Lumbago with sciatica, left side: Secondary | ICD-10-CM

## 2022-06-27 DIAGNOSIS — G8929 Other chronic pain: Secondary | ICD-10-CM

## 2022-06-27 DIAGNOSIS — Z96642 Presence of left artificial hip joint: Secondary | ICD-10-CM | POA: Diagnosis not present

## 2022-06-28 ENCOUNTER — Encounter: Payer: Self-pay | Admitting: Surgical

## 2022-06-28 DIAGNOSIS — N3946 Mixed incontinence: Secondary | ICD-10-CM | POA: Diagnosis not present

## 2022-06-28 DIAGNOSIS — R350 Frequency of micturition: Secondary | ICD-10-CM | POA: Diagnosis not present

## 2022-06-28 NOTE — Progress Notes (Signed)
Office Visit Note   Patient: Sarah Rojas           Date of Birth: 12-18-1940           MRN: [id number] Visit Date: 06/27/2022 Requested by: Kalman Shan, MD [address] [place],  [place] [address] PCP: Kalman Shan, MD  Subjective: Chief Complaint  Patient presents with   Left Hip - Pain    HPI: Sarah Rojas is a 82 y.o. female who presents to the office reporting left hip pain.  Patient states she has had left-sided low back pain with radiation into her groin and down the lateral aspect of her thigh to the knee.  Also has anterior thigh pain.  No radiation of pain past the knee.  Does have some numbness and tingling.  Has history of prior hip replacement for hip fracture that was done in May 2023 by Dr. August Saucer.  Has had prednisone course from her primary provider that helped temporarily but after she stopped the steroids, she had return of symptoms.  Taking Tylenol for pain which is not really helping.  Has difficulty sleeping at night due to the pain.  Pain in her low back bothers her more than pain in her hip.  She is able to weight-bear.  No fevers or chills..                ROS: All systems reviewed are negative as they relate to the chief complaint within the history of present illness.  Patient denies fevers or chills.  Assessment & Plan: Visit Diagnoses:  1. Chronic left-sided low back pain with left-sided sciatica   2. S/P hip replacement, left     Plan: Patient is a 82 year old female who presents for evaluation of left hip pain and low back pain.  Has history of left total hip arthroplasty for hip fracture back in May 2023.  Did well following this procedure though she only followed up once.  She has no sign of prosthetic joint infection based on her history or by exam today.  Most of her pain seems related to her low back as she is very tender throughout the low back that is much more exaggerated compared with any pain that can be reproduced with  hip range of motion.  Will plan to order MRI of the lumbar spine for further evaluation of exactly where her back is worst in order to target this with likely ESI's to follow.  She also does have some hamstring weakness on the left side that is asymmetric compared to right side.  Follow-up after MRI to review results.  Follow-Up Instructions: No follow-ups on file.   Orders:  Orders Placed This Encounter  Procedures   XR HIP UNILAT W OR W/O PELVIS 2-3 VIEWS LEFT   XR Lumbar Spine 2-3 Views   MR Lumbar Spine w/o contrast   No orders of the defined types were placed in this encounter.     Procedures: No procedures performed   Clinical Data: No additional findings.  Objective: Vital Signs: There were no vitals taken for this visit.  Physical Exam:  Constitutional: Patient appears well-developed HEENT:  Head: Normocephalic Eyes:EOM are normal Neck: Normal range of motion Cardiovascular: Normal rate Pulmonary/chest: Effort normal Neurologic: Patient is alert Skin: Skin is warm Psychiatric: Patient has normal mood and affect  Ortho Exam: Patient exam demonstrates mild pain with FADIR sign.  Negative Stinchfield sign but positive straight leg raise reproducing not groin pain but  low back pain primarily.  No reproduction of radicular pain.  She has no pain with logroll of the left hip.  Intact hip flexion, quadricep, dorsiflexion, plantarflexion, EHL rated 5/5.  Hamstring strength of the left lower extremity is 4/5 relative to 5/5 motor strength in the right leg.  Specialty Comments:  No specialty comments available.  Imaging: No results found.   PMFS History: Patient Active Problem List   Diagnosis Date Noted   Neurocognitive deficits 05/31/2021   Incidental pulmonary nodule 05/31/2021   Constipation    Closed fracture of left hip (HCC)    Protein-calorie malnutrition, severe 05/22/2021   COPD not affecting current episode of care 05/21/2021   Dyslipidemia 05/21/2021    Hypotension, chronic 05/21/2021   Anxiety disorder 05/21/2021   Chronic kidney disease, stage 3b (HCC) 05/21/2021   Displaced fracture of left femoral neck (HCC) 05/20/2021   Hypoglycemia 05/18/2019   Tobacco abuse 05/18/2019   Past Medical History:  Diagnosis Date   COPD (chronic obstructive pulmonary disease) (HCC)    High cholesterol    Uterine cancer (HCC) 1970    Family History  Problem Relation Age of Onset   Diabetes Mellitus II Daughter     Past Surgical History:  Procedure Laterality Date   ABDOMINAL HYSTERECTOMY     TOTAL HIP ARTHROPLASTY Left 05/21/2021   Procedure: TOTAL HIP ARTHROPLASTY ANTERIOR APPROACH;  Surgeon: Cammy Copa, MD;  Location: [facility] OR;  Service: Orthopedics;  Laterality: Left;   Social History   Occupational History   Occupation: retired  Tobacco Use   Smoking status: Every Day    Packs/day: 1.00    Years: 65.00    Additional pack years: 0.00    Total pack years: 65.00    Types: Cigarettes   Smokeless tobacco: Never  Vaping Use   Vaping Use: Never used  Substance and Sexual Activity   Alcohol use: Never   Drug use: Never   Sexual activity: Not on file

## 2022-07-02 ENCOUNTER — Ambulatory Visit
Admission: RE | Admit: 2022-07-02 | Discharge: 2022-07-02 | Disposition: A | Discharge status: Home / Self Care | Payer: PPO | Source: Ambulatory Visit | Attending: Surgical | Admitting: Surgical

## 2022-07-02 DIAGNOSIS — G8929 Other chronic pain: Secondary | ICD-10-CM

## 2022-07-02 DIAGNOSIS — M47816 Spondylosis without myelopathy or radiculopathy, lumbar region: Secondary | ICD-10-CM | POA: Diagnosis not present

## 2022-07-02 DIAGNOSIS — M48061 Spinal stenosis, lumbar region without neurogenic claudication: Secondary | ICD-10-CM | POA: Diagnosis not present

## 2022-07-12 ENCOUNTER — Other Ambulatory Visit: Payer: Self-pay

## 2022-07-12 ENCOUNTER — Telehealth: Payer: Self-pay | Admitting: Surgical

## 2022-07-12 DIAGNOSIS — G8929 Other chronic pain: Secondary | ICD-10-CM

## 2022-07-12 NOTE — Telephone Encounter (Signed)
I called Sarah Rojas.  Wants to get his mom set up with Dr. Alvester Morin to try L-spine ESI.  Can we get this set up and can you have them call Sarah Rojas to set up the date and time as he is the driver?  Thank you

## 2022-07-12 NOTE — Telephone Encounter (Signed)
Placed order

## 2022-07-12 NOTE — Telephone Encounter (Signed)
Pt son called in stating Franky Macho was going to call and review the MRI results over the phone and he wasted him to call his phone instead of speaking with mother directly  Ermina Oberman Son [phone]

## 2022-07-24 DIAGNOSIS — M792 Neuralgia and neuritis, unspecified: Secondary | ICD-10-CM | POA: Diagnosis not present

## 2022-07-24 DIAGNOSIS — F411 Generalized anxiety disorder: Secondary | ICD-10-CM | POA: Diagnosis not present

## 2022-07-24 DIAGNOSIS — F17200 Nicotine dependence, unspecified, uncomplicated: Secondary | ICD-10-CM | POA: Diagnosis not present

## 2022-07-24 DIAGNOSIS — J432 Centrilobular emphysema: Secondary | ICD-10-CM | POA: Diagnosis not present

## 2022-07-24 DIAGNOSIS — I700 Atherosclerosis of aorta: Secondary | ICD-10-CM | POA: Diagnosis not present

## 2022-07-24 DIAGNOSIS — N3281 Overactive bladder: Secondary | ICD-10-CM | POA: Diagnosis not present

## 2022-07-24 DIAGNOSIS — I951 Orthostatic hypotension: Secondary | ICD-10-CM | POA: Diagnosis not present

## 2022-07-24 DIAGNOSIS — E782 Mixed hyperlipidemia: Secondary | ICD-10-CM | POA: Diagnosis not present

## 2022-07-27 ENCOUNTER — Other Ambulatory Visit: Payer: PPO

## 2022-08-01 ENCOUNTER — Other Ambulatory Visit: Payer: Self-pay

## 2022-08-01 ENCOUNTER — Ambulatory Visit: Payer: PPO | Admitting: Physical Medicine and Rehabilitation

## 2022-08-01 VITALS — BP 162/87 | HR 87

## 2022-08-01 DIAGNOSIS — M542 Cervicalgia: Secondary | ICD-10-CM

## 2022-08-01 DIAGNOSIS — M5416 Radiculopathy, lumbar region: Secondary | ICD-10-CM | POA: Diagnosis not present

## 2022-08-01 MED ORDER — METHYLPREDNISOLONE ACETATE 80 MG/ML IJ SUSP
80.0000 mg | Freq: Once | INTRAMUSCULAR | Status: AC
  Administered 2022-08-01: 80 mg

## 2022-08-01 NOTE — Progress Notes (Signed)
Functional Pain Scale - descriptive words and definitions  Distracting (5)    Aware of pain/able to complete some ADL's but limited by pain/sleep is affected and active distractions are only slightly useful. Moderate range order  Average Pain 4   +Driver, -BT, -Dye Allergies.  Lower back pain on both sides that radiates into the legs

## 2022-08-01 NOTE — Procedures (Signed)
Lumbar Epidural Steroid Injection - Interlaminar Approach with Fluoroscopic Guidance  Patient: Sarah Rojas      Date of Birth: 1940-01-10 MRN: [id number] PCP: Kalman Shan, MD      Visit Date: 08/01/2022   Universal Protocol:     Consent Given By: the patient  Position: PRONE  Additional Comments: Vital signs were monitored before and after the procedure. Patient was prepped and draped in the usual sterile fashion. The correct patient, procedure, and site was verified.   Injection Procedure Details:   Procedure diagnoses: Lumbar radiculopathy [M54.16]   Meds Administered:  Meds ordered this encounter  Medications   methylPREDNISolone acetate (DEPO-MEDROL) injection 80 mg     Laterality: Left  Location/Site:  L5-S1  Needle: 3.5 in., 20 ga. Tuohy  Needle Placement: Paramedian epidural  Findings:   -Comments: Excellent flow of contrast into the epidural space.  Procedure Details: Using a paramedian approach from the side mentioned above, the region overlying the inferior lamina was localized under fluoroscopic visualization and the soft tissues overlying this structure were infiltrated with 4 ml. of 1% Lidocaine without Epinephrine. The Tuohy needle was inserted into the epidural space using a paramedian approach.   The epidural space was localized using loss of resistance along with counter oblique bi-planar fluoroscopic views.  After negative aspirate for air, blood, and CSF, a 2 ml. volume of Isovue-250 was injected into the epidural space and the flow of contrast was observed. Radiographs were obtained for documentation purposes.    The injectate was administered into the level noted above.   Additional Comments:  No complications occurred Dressing: 2 x 2 sterile gauze and Band-Aid    Post-procedure details: Patient was observed during the procedure. Post-procedure instructions were reviewed.  Patient left the clinic in stable condition.

## 2022-08-01 NOTE — Patient Instructions (Signed)

## 2022-08-03 NOTE — Progress Notes (Signed)
ECE CUMBER[place] - 82 y.o. female MRN [id number]  Date of birth: [birth year]  Office Visit Note: Visit Date: 08/01/2022 PCP: Kalman Shan, MD Referred by: Kalman Shan, MD  Subjective: Chief Complaint  Patient presents with   Lower Back - Pain   HPI:  Sarah Rojas is a 82 y.o. female who comes in today at the request of Dr. Burnard Bunting and Karenann Cai, PA-C for planned Midline L5-S1 Lumbar Interlaminar epidural steroid injection with fluoroscopic guidance.  The patient has failed conservative care including home exercise, medications, time and activity modification.  This injection will be diagnostic and hopefully therapeutic.  Please see requesting physician notes for further details and justification.  Even though she was coming in today for a planned injection that she seems to be fairly upset that Dr. August Saucer and Franky Macho did not address her neck pain and shoulder pain and arm pain particularly on the right.  She tells me at length today that her main doctor has been Dr. Ophelia Charter and her husband used to see Dr. Otelia Sergeant.  After discussion she really wanted to see Dr. Ophelia Charter in follow-up for her neck.  We did place a referral for that today.   ROS Otherwise per HPI.  Assessment & Plan: Visit Diagnoses:    ICD-10-CM   1. Lumbar radiculopathy  M54.16 XR C-ARM NO REPORT    Epidural Steroid injection    methylPREDNISolone acetate (DEPO-MEDROL) injection 80 mg    2. Cervical pain  M54.2 Ambulatory referral to Orthopedic Surgery      Plan: No additional findings.   Meds & Orders:  Meds ordered this encounter  Medications   methylPREDNISolone acetate (DEPO-MEDROL) injection 80 mg    Orders Placed This Encounter  Procedures   XR C-ARM NO REPORT   Ambulatory referral to Orthopedic Surgery   Epidural Steroid injection    Follow-up: Return for visit to requesting provider as needed.   Procedures: No procedures performed  Lumbar Epidural Steroid Injection - Interlaminar  Approach with Fluoroscopic Guidance  Patient: Sarah Rojas      Date of Birth: 05-10-1940 MRN: [id number] PCP: Kalman Shan, MD      Visit Date: 08/01/2022   Universal Protocol:     Consent Given By: the patient  Position: PRONE  Additional Comments: Vital signs were monitored before and after the procedure. Patient was prepped and draped in the usual sterile fashion. The correct patient, procedure, and site was verified.   Injection Procedure Details:   Procedure diagnoses: Lumbar radiculopathy [M54.16]   Meds Administered:  Meds ordered this encounter  Medications   methylPREDNISolone acetate (DEPO-MEDROL) injection 80 mg     Laterality: Left  Location/Site:  L5-S1  Needle: 3.5 in., 20 ga. Tuohy  Needle Placement: Paramedian epidural  Findings:   -Comments: Excellent flow of contrast into the epidural space.  Procedure Details: Using a paramedian approach from the side mentioned above, the region overlying the inferior lamina was localized under fluoroscopic visualization and the soft tissues overlying this structure were infiltrated with 4 ml. of 1% Lidocaine without Epinephrine. The Tuohy needle was inserted into the epidural space using a paramedian approach.   The epidural space was localized using loss of resistance along with counter oblique bi-planar fluoroscopic views.  After negative aspirate for air, blood, and CSF, a 2 ml. volume of Isovue-250 was injected into the epidural space and the flow of contrast was observed. Radiographs were obtained for documentation purposes.    The injectate was administered  into the level noted above.   Additional Comments:  No complications occurred Dressing: 2 x 2 sterile gauze and Band-Aid    Post-procedure details: Patient was observed during the procedure. Post-procedure instructions were reviewed.  Patient left the clinic in stable condition.    Clinical History: Narrative & Impression CLINICAL  DATA:  Chronic left-sided low back and left leg pain. History of prior left hip replacement.   EXAM: MRI LUMBAR SPINE WITHOUT CONTRAST   TECHNIQUE: Multiplanar, multisequence MR imaging of the lumbar spine was performed. No intravenous contrast was administered.   COMPARISON:  Plain films lumbar spine 06/27/22.   FINDINGS: Segmentation:  Standard.   Alignment: Trace anterolisthesis L3 on L4. Alignment is otherwise normal.   Vertebrae:  No fracture, evidence of discitis, or bone lesion.   Conus medullaris and cauda equina: Conus extends to the L1 level. Conus and cauda equina appear normal.   Paraspinal and other soft tissues: Negative.   Disc levels:   T10-11 and T11-12 are imaged in the sagittal plane only and appear normal.   T12-L1: Negative.   L1-2: Negative.   L2-3: Mild facet degenerative change.  Otherwise negative.   L3-4: There is a shallow disc bulge and mild facet arthropathy. There is mild central canal stenosis. The foramina are open.   L4-5: Mild facet arthropathy and very shallow disc bulging. No stenosis.   L5-S1: Negative.   IMPRESSION: Mild lumbar spondylosis appears worst at L3-4 where there is mild central canal stenosis. The foramina are open at all levels.     Electronically Signed   By: Drusilla Kanner M.D.   On: 07/09/2022 10:14     Objective:  VS:  HT:    WT:   BMI:     BP:(!) 162/87  HR:87bpm  TEMP: ( )  RESP:  Physical Exam Vitals and nursing note reviewed.  Constitutional:      General: She is not in acute distress.    Appearance: Normal appearance. She is not ill-appearing.  HENT:     Head: Normocephalic and atraumatic.     Right Ear: External ear normal.     Left Ear: External ear normal.  Eyes:     Extraocular Movements: Extraocular movements intact.  Cardiovascular:     Rate and Rhythm: Normal rate.     Pulses: Normal pulses.  Pulmonary:     Effort: Pulmonary effort is normal. No respiratory distress.   Abdominal:     General: There is no distension.     Palpations: Abdomen is soft.  Musculoskeletal:        General: Tenderness present.     Cervical back: Neck supple.     Right lower leg: No edema.     Left lower leg: No edema.     Comments: Patient has good distal strength with no pain over the greater trochanters.  No clonus or focal weakness.  Skin:    Findings: No erythema, lesion or rash.  Neurological:     General: No focal deficit present.     Mental Status: She is alert and oriented to person, place, and time.     Sensory: No sensory deficit.     Motor: No weakness or abnormal muscle tone.     Coordination: Coordination normal.  Psychiatric:        Mood and Affect: Mood normal.        Behavior: Behavior normal.      Imaging: No results found.

## 2022-08-22 IMAGING — CT CT CHEST W/O CM
2 of 4 series · 15 of 36 positions shown, 18 images · non-contrast
Comparison: 11/24/2019

CLINICAL DATA: Follow-up pulmonary nodules, smoker

EXAM:
CT CHEST WITHOUT CONTRAST
TECHNIQUE: Multidetector CT imaging of the chest was performed following the
standard protocol without IV contrast.

[Series 2: chest 2.00 br40 s3 · axial · 0.49mm/px · z∈[+1698,+1956]mm · 12 of 153 slices shown, 15 images (1 of 2)]
[im 12/153  mediastinal]
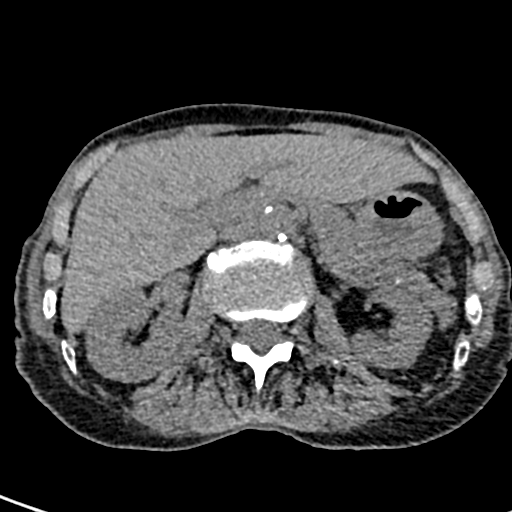
[im 12/153  lung]
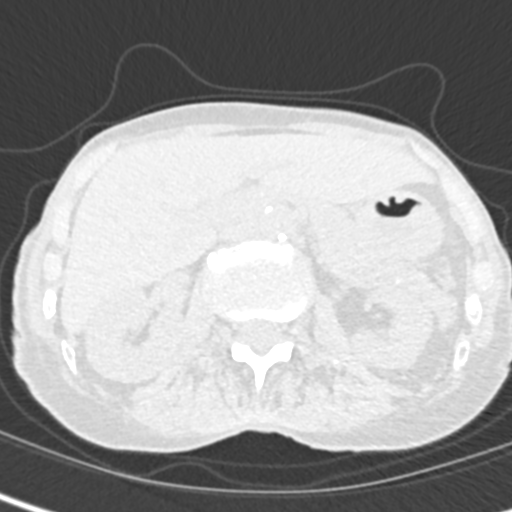
[im 24/153  lung]
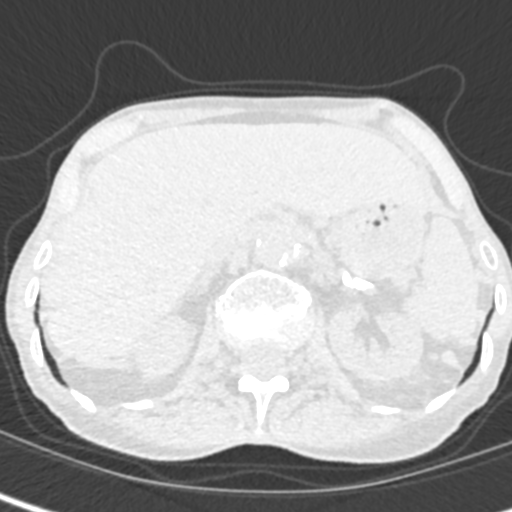
[im 36/153  lung]
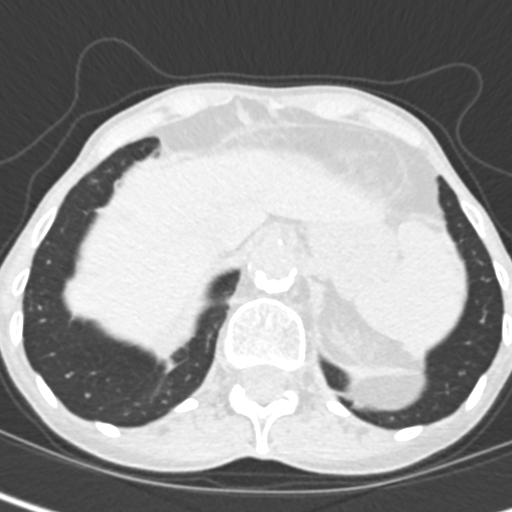
[im 47/153  lung]
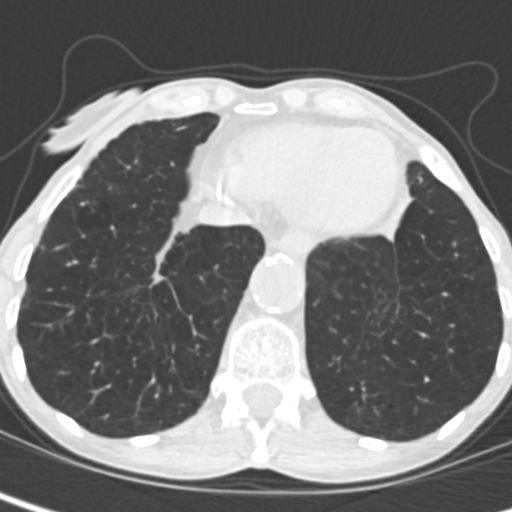
[im 59/153  mediastinal]
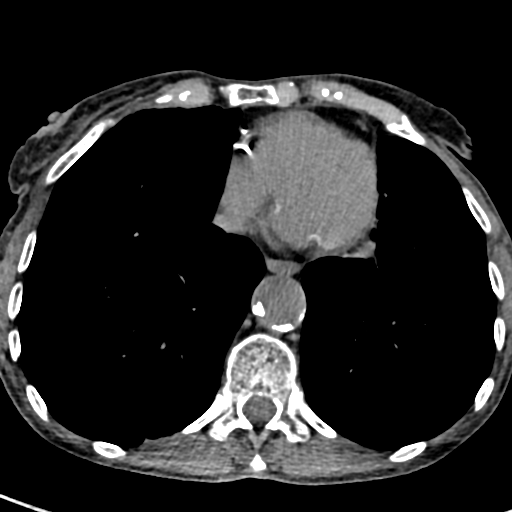
[im 59/153  lung]
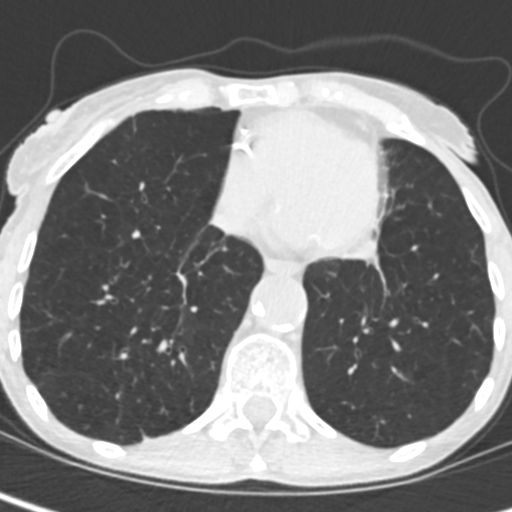
[im 71/153  lung]
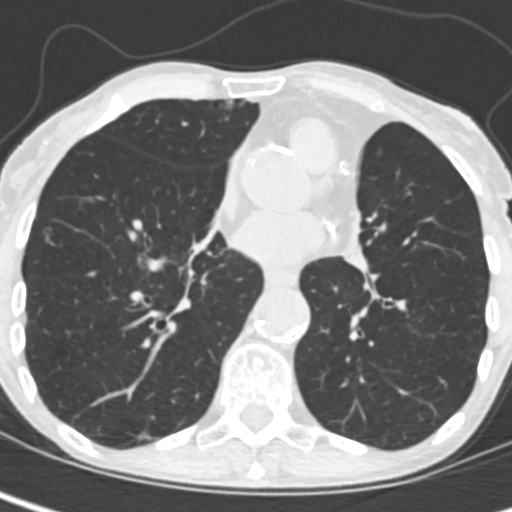
[im 82/153  lung]
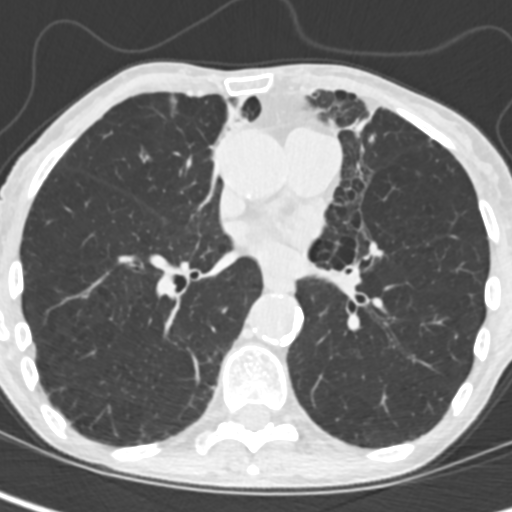
[im 94/153  lung]
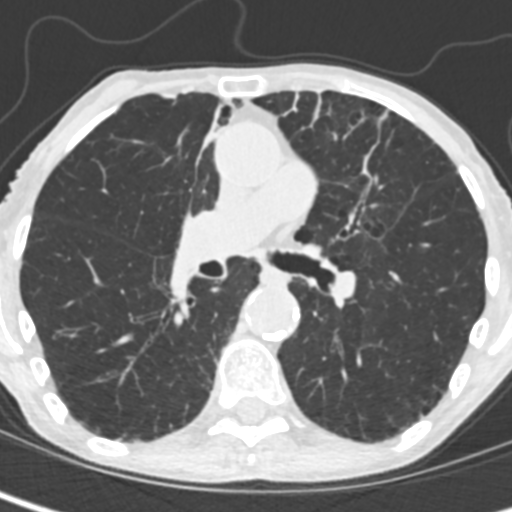
[im 106/153  mediastinal]
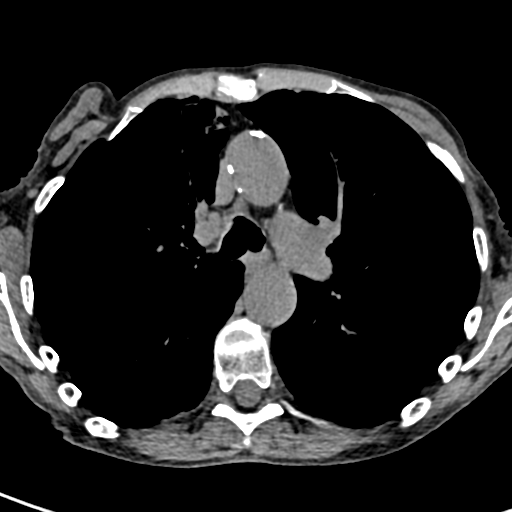
[im 106/153  lung]
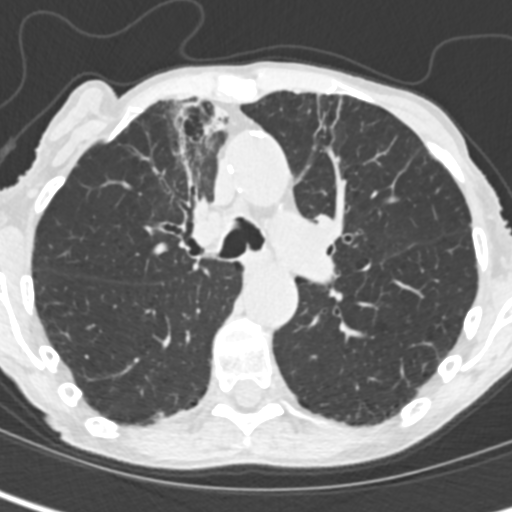
[im 117/153  lung]
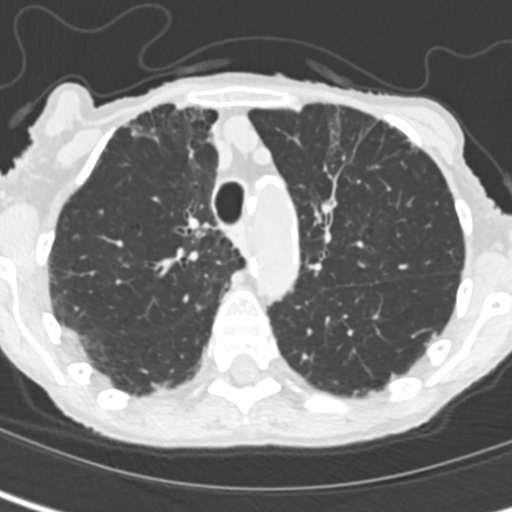
[im 129/153  lung]
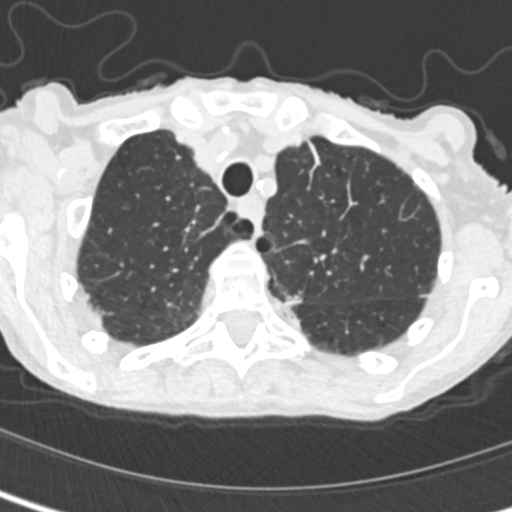
[im 141/153  lung]
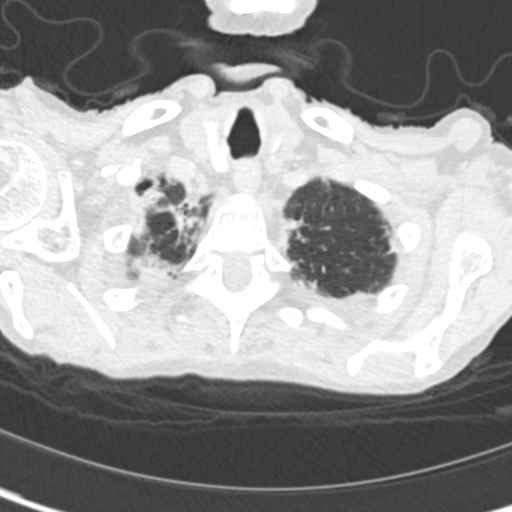

[Series 4: chest 2.00 br40 s3 · coronal · 0.49mm/px · 3 of 124 slices shown (2 of 2)]
[im 25/124  lung]
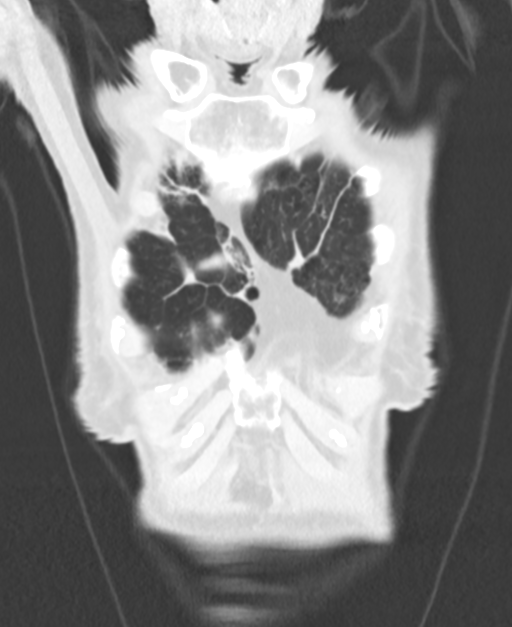
[im 50/124  lung]
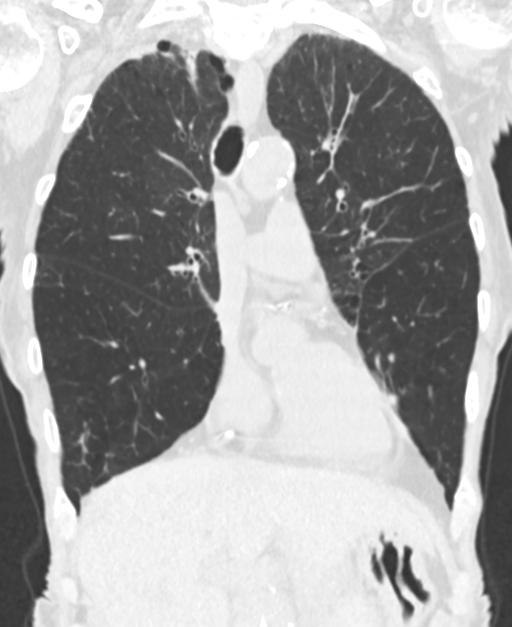
[im 74/124  lung]
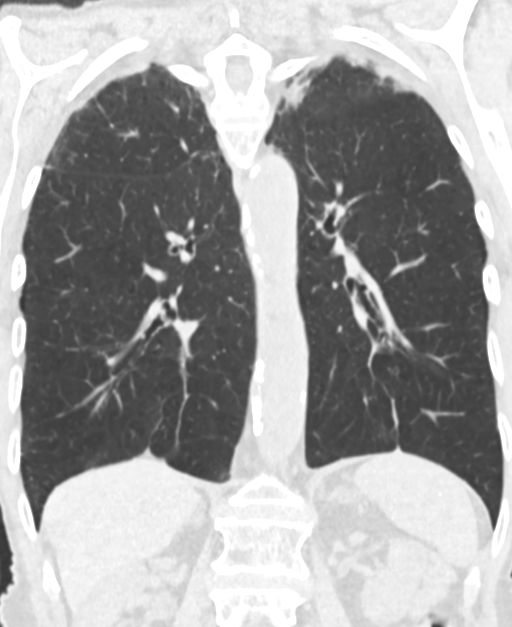

[15 of 36 positions shown; findings below may reference images not displayed]

FINDINGS: Cardiovascular: Aortic atherosclerosis. Normal heart size.
Three-vessel coronary artery calcifications. No pericardial
effusion.

Mediastinum/Nodes: No enlarged mediastinal, hilar, or axillary lymph
nodes. Thyroid gland, trachea, and esophagus demonstrate no
significant findings.

Lungs/Pleura: Mild centrilobular and paraseptal emphysema. Biapical
pleuroparenchymal scarring. There is bandlike scarring of the medial
upper lobes and medial segment right middle lobe. A previously noted
3 mm nodule in the extreme left lung base is resolved (series 8,
image 119). A previously noted 5 mm ground-glass nodule in the
dependent lung left lung base is almost completely resolved and no
longer distinct (series 8, image 74). No pleural effusion or
pneumothorax.

Upper Abdomen: No acute abnormality.

Musculoskeletal: No chest wall mass or suspicious bone lesions
identified.
IMPRESSION: 1. A previously noted 3 mm nodule in the extreme left lung base is
resolved. A previously noted 5 mm ground-glass nodule in the
dependent lung left lung base is almost completely resolved and no
longer distinct. Findings are consistent with resolution of
infection or inflammation. No further follow-up or characterization
is required.
2. Mild centrilobular and paraseptal emphysema.
3. Unchanged scarring.
4. Coronary artery disease.

Aortic Atherosclerosis (W9E1H-OUX.X) and Emphysema (W9E1H-0UX.2).

## 2022-10-09 ENCOUNTER — Ambulatory Visit: Payer: PPO | Admitting: Orthopaedic Surgery

## 2022-10-23 ENCOUNTER — Ambulatory Visit: Payer: PPO | Admitting: Orthopaedic Surgery

## 2023-01-18 DIAGNOSIS — J432 Centrilobular emphysema: Secondary | ICD-10-CM | POA: Diagnosis not present

## 2023-01-18 DIAGNOSIS — M792 Neuralgia and neuritis, unspecified: Secondary | ICD-10-CM | POA: Diagnosis not present

## 2023-01-18 DIAGNOSIS — R5383 Other fatigue: Secondary | ICD-10-CM | POA: Diagnosis not present

## 2023-01-18 DIAGNOSIS — N3281 Overactive bladder: Secondary | ICD-10-CM | POA: Diagnosis not present

## 2023-01-18 DIAGNOSIS — I951 Orthostatic hypotension: Secondary | ICD-10-CM | POA: Diagnosis not present

## 2023-01-18 DIAGNOSIS — I700 Atherosclerosis of aorta: Secondary | ICD-10-CM | POA: Diagnosis not present

## 2023-01-18 DIAGNOSIS — F17200 Nicotine dependence, unspecified, uncomplicated: Secondary | ICD-10-CM | POA: Diagnosis not present

## 2023-01-18 DIAGNOSIS — F411 Generalized anxiety disorder: Secondary | ICD-10-CM | POA: Diagnosis not present

## 2023-01-18 DIAGNOSIS — E782 Mixed hyperlipidemia: Secondary | ICD-10-CM | POA: Diagnosis not present

## 2023-02-15 DIAGNOSIS — J432 Centrilobular emphysema: Secondary | ICD-10-CM | POA: Diagnosis not present

## 2023-02-15 DIAGNOSIS — N3281 Overactive bladder: Secondary | ICD-10-CM | POA: Diagnosis not present

## 2023-02-15 DIAGNOSIS — N1831 Chronic kidney disease, stage 3a: Secondary | ICD-10-CM | POA: Diagnosis not present

## 2023-02-15 DIAGNOSIS — F17200 Nicotine dependence, unspecified, uncomplicated: Secondary | ICD-10-CM | POA: Diagnosis not present

## 2023-02-15 DIAGNOSIS — M792 Neuralgia and neuritis, unspecified: Secondary | ICD-10-CM | POA: Diagnosis not present

## 2023-02-15 DIAGNOSIS — I951 Orthostatic hypotension: Secondary | ICD-10-CM | POA: Diagnosis not present

## 2023-02-15 DIAGNOSIS — E782 Mixed hyperlipidemia: Secondary | ICD-10-CM | POA: Diagnosis not present

## 2023-02-15 DIAGNOSIS — F411 Generalized anxiety disorder: Secondary | ICD-10-CM | POA: Diagnosis not present

## 2023-02-15 DIAGNOSIS — Z23 Encounter for immunization: Secondary | ICD-10-CM | POA: Diagnosis not present

## 2023-02-15 DIAGNOSIS — Z0000 Encounter for general adult medical examination without abnormal findings: Secondary | ICD-10-CM | POA: Diagnosis not present

## 2023-02-15 DIAGNOSIS — J410 Simple chronic bronchitis: Secondary | ICD-10-CM | POA: Diagnosis not present

## 2023-09-09 DIAGNOSIS — E782 Mixed hyperlipidemia: Secondary | ICD-10-CM | POA: Diagnosis not present

## 2023-09-09 DIAGNOSIS — N1831 Chronic kidney disease, stage 3a: Secondary | ICD-10-CM | POA: Diagnosis not present

## 2023-09-13 DIAGNOSIS — N3281 Overactive bladder: Secondary | ICD-10-CM | POA: Diagnosis not present

## 2023-09-13 DIAGNOSIS — F17200 Nicotine dependence, unspecified, uncomplicated: Secondary | ICD-10-CM | POA: Diagnosis not present

## 2023-09-13 DIAGNOSIS — I951 Orthostatic hypotension: Secondary | ICD-10-CM | POA: Diagnosis not present

## 2023-09-13 DIAGNOSIS — N1831 Chronic kidney disease, stage 3a: Secondary | ICD-10-CM | POA: Diagnosis not present

## 2023-09-13 DIAGNOSIS — M25562 Pain in left knee: Secondary | ICD-10-CM | POA: Diagnosis not present

## 2023-09-13 DIAGNOSIS — J432 Centrilobular emphysema: Secondary | ICD-10-CM | POA: Diagnosis not present

## 2023-09-13 DIAGNOSIS — M25561 Pain in right knee: Secondary | ICD-10-CM | POA: Diagnosis not present

## 2023-09-13 DIAGNOSIS — J410 Simple chronic bronchitis: Secondary | ICD-10-CM | POA: Diagnosis not present

## 2023-09-13 DIAGNOSIS — M792 Neuralgia and neuritis, unspecified: Secondary | ICD-10-CM | POA: Diagnosis not present

## 2023-09-13 DIAGNOSIS — E782 Mixed hyperlipidemia: Secondary | ICD-10-CM | POA: Diagnosis not present

## 2023-09-13 DIAGNOSIS — F411 Generalized anxiety disorder: Secondary | ICD-10-CM | POA: Diagnosis not present

## 2023-09-17 ENCOUNTER — Ambulatory Visit (INDEPENDENT_AMBULATORY_CARE_PROVIDER_SITE_OTHER)

## 2023-09-17 ENCOUNTER — Encounter: Payer: Self-pay | Admitting: Cardiology

## 2023-09-17 ENCOUNTER — Ambulatory Visit: Attending: Cardiology | Admitting: Cardiology

## 2023-09-17 VITALS — BP 150/69 | HR 81 | Resp 16 | Ht 60.0 in | Wt 113.2 lb

## 2023-09-17 DIAGNOSIS — I10 Essential (primary) hypertension: Secondary | ICD-10-CM | POA: Diagnosis not present

## 2023-09-17 DIAGNOSIS — R42 Dizziness and giddiness: Secondary | ICD-10-CM | POA: Diagnosis not present

## 2023-09-17 DIAGNOSIS — E782 Mixed hyperlipidemia: Secondary | ICD-10-CM | POA: Diagnosis not present

## 2023-09-17 DIAGNOSIS — I951 Orthostatic hypotension: Secondary | ICD-10-CM

## 2023-09-17 DIAGNOSIS — R002 Palpitations: Secondary | ICD-10-CM

## 2023-09-17 DIAGNOSIS — Z87891 Personal history of nicotine dependence: Secondary | ICD-10-CM

## 2023-09-17 NOTE — Progress Notes (Unsigned)
 Applied a 14 day Zio XT monitor to patient in the office ?

## 2023-09-17 NOTE — Progress Notes (Signed)
 Cardiology Office Note:    Date:  09/17/2023  NAME:  Sarah Rojas    MRN: [id number] DOB:  [birth year]   PCP:  Geronimo Amel, MD  Former Cardiology Providers: None Primary Cardiologist:  Madonna Large, DO, [facility] (established care 09/17/2023) Electrophysiologist:  None   Referring MD: Verdia Lombard, MD  Reason of Consult: Orthostatic hypotension  No chief complaint on file.   History of Present Illness:    Sarah GROSECLOSE is a 83 y.o. Caucasian female whose past medical history and cardiovascular risk factors includes: TIA (per patient 2022), Centrilobular emphysema, former cigarette smoking, hyperlipidemia, orthostatic hypotension. She is being seen today for the evaluation of orthostatic hypotension at the request of Verdia Lombard, MD.  According to progress note from PCP dated February 15, 2023 it mentions patient has had  long history of soft blood pressures.  Presumed to be worsening.  No syncopal events.  Started on low-dose midodrine .  PCP felt that uptitration of midodrine  may not be beneficial and therefore cardiology was consulted for further evaluation and management if cardiology workup is unremarkable they will consider workup for vertigo.  Patient is accompanied by her son Jama at today's office visit.  Dizziness/vertigo: Ongoing for several years. No change in intensity frequency or duration. Predominantly noticeable with changing positions (supine to sitting or sitting to standing) or turning her head side-to-side  When she has these episodes she experiences nausea, diaphoresis, and tunnel to vision. She denies vomiting. No recent syncopal event.  Last episode was in 2023. Patient has not been evaluated by ENT or neurology. Patient denies use of alcohol. Drinks couple bottles of water  a day. Consumes at least 4 cups of coffee. No energy drinks.  Her blood pressure at home ranges between 140-160 mmHg.  Patient lives alone.  Ambulates with a  walker. Does stationary exercise that she learned during her last hospitalization.   Strong history of cigarette smoking, quit in 2023  Current Medications: Current Meds  Medication Sig   acetaminophen  (TYLENOL ) 325 MG tablet Take 2 tablets (650 mg total) by mouth every 6 (six) hours as needed for mild pain (or Fever >/= 101).   albuterol  (VENTOLIN  HFA) 108 (90 Base) MCG/ACT inhaler Inhale 1 puff into the lungs every 4 (four) hours as needed for wheezing or shortness of breath.   Aspirin -Salicylamide-Caffeine (BC HEADACHE POWDER PO) Take 1 packet by mouth 2 (two) times daily as needed (pain/headache).   diazepam  (VALIUM ) 5 MG tablet Take 1 tablet (5 mg total) by mouth every 8 (eight) hours as needed for anxiety.   feeding supplement (ENSURE ENLIVE / ENSURE PLUS) LIQD Take 237 mLs by mouth 3 (three) times daily between meals.   midodrine  (PROAMATINE ) 5 MG tablet Take 1 tablet (5 mg total) by mouth 3 (three) times daily.   Multiple Vitamin (MULTIVITAMIN WITH MINERALS) TABS tablet Take 1 tablet by mouth every morning. Centrum   polyvinyl alcohol (LIQUIFILM TEARS) 1.4 % ophthalmic solution Place 1 drop into both eyes daily as needed for dry eyes.   predniSONE (DELTASONE) 10 MG tablet Take 10 mg by mouth daily with breakfast.   rosuvastatin  (CRESTOR ) 5 MG tablet Take 5 mg by mouth every morning.     Allergies:    Latex   Past Medical History: Past Medical History:  Diagnosis Date   COPD (chronic obstructive pulmonary disease) (HCC)    High cholesterol    Uterine cancer (HCC) 1970    Past Surgical History: Past Surgical History:  Procedure Laterality  Date   ABDOMINAL HYSTERECTOMY     TOTAL HIP ARTHROPLASTY Left 05/21/2021   Procedure: TOTAL HIP ARTHROPLASTY ANTERIOR APPROACH;  Surgeon: Addie Cordella Hamilton, MD;  Location: [facility] OR;  Service: Orthopedics;  Laterality: Left;    Social History: Social History   Tobacco Use   Smoking status: Former    Current packs/day: 1.00    Average  packs/day: 1 pack/day for 65.0 years (65.0 ttl pk-yrs)    Types: Cigarettes   Smokeless tobacco: Never  Vaping Use   Vaping status: Never Used  Substance Use Topics   Alcohol use: Never   Drug use: Never    Family History: Family History  Problem Relation Age of Onset   Diabetes Mellitus II Daughter     ROS:   Review of Systems  Cardiovascular:  Positive for palpitations (chronic). Negative for chest pain, claudication, irregular heartbeat, leg swelling, near-syncope, orthopnea, paroxysmal nocturnal dyspnea and syncope.  Respiratory:  Positive for shortness of breath (chronic).   Hematologic/Lymphatic: Negative for bleeding problem.  Neurological:  Positive for dizziness (chronic) and vertigo (chronic).    EKGs/Labs/Other Studies Reviewed:   EKG: EKG Interpretation Date/Time:  Tuesday September 17 2023 14:17:02 EDT Ventricular Rate:  74 PR Interval:  132 QRS Duration:  70 QT Interval:  376 QTC Calculation: 417 R Axis:   73  Text Interpretation: Normal sinus rhythm Possible Anterior infarct , age undetermined When compared with ECG of 20-May-2021 18:47, PREVIOUS ECG IS PRESENT Confirmed by Michele Richardson [phone]) on 09/17/2023 2:23:50 PM  Echocardiogram: 05/2019  1. Left ventricular ejection fraction, by estimation, is 65 to 70%. The left ventricle has normal function. The left ventricle has no regional wall motion abnormalities. There is mild left ventricular hypertrophy.  Left ventricular diastolic parameters are consistent with age-related delayed relaxation (normal).   2. Right ventricular systolic function is normal. The right ventricular size is normal.   3. The mitral valve is grossly normal. Trivial mitral valve regurgitation.   4. The aortic valve is tricuspid. Aortic valve regurgitation is not visualized.   5. The inferior vena cava is normal in size with greater than 50% respiratory variability, suggesting right atrial pressure of 3 mmHg.   Labs: External  Labs: Collected: January 18, 2023 by primary care provider. Hemoglobin 12.3 g/dL. Total cholesterol 188, triglyceride 116, HDL 64, LDL 104. BUN 17, creatinine 1.22. AST, ALT, alkaline phosphatase within normal notes. TSH 5.23  Physical Exam:    Today's Vitals   09/17/23 1411  BP: (!) 150/69  Pulse: 81  Resp: 16  Weight: 113 lb 3.2 oz (51.3 kg)  Height: 5' (1.524 m)   Body mass index is 22.11 kg/m. Wt Readings from Last 3 Encounters:  09/17/23 113 lb 3.2 oz (51.3 kg)  05/26/21 98 lb 1.8 oz (44.5 kg)  05/21/21 98 lb 1.7 oz (44.5 kg)      Physical Exam  Constitutional: No distress. She appears chronically ill.  hemodynamically stable  Neck: No JVD present.  Cardiovascular: Normal rate, regular rhythm, S1 normal and S2 normal. Exam reveals no gallop, no S3 and no S4.  No murmur heard. Very subtle bruit bilaterally  Pulmonary/Chest: Effort normal and breath sounds normal. No stridor. She has no wheezes. She has no rales.  Musculoskeletal:        General: No edema.     Cervical back: Neck supple.  Skin: Skin is warm.    Impression & Recommendation(s):  Impression:   ICD-10-CM   1. Orthostatic hypotension  I95.1 EKG 12-Lead  2. Dizziness  R42 VAS US  CAROTID    ECHOCARDIOGRAM COMPLETE    LONG TERM MONITOR (3-14 DAYS)    3. Palpitations  R00.2 VAS US  CAROTID    ECHOCARDIOGRAM COMPLETE    LONG TERM MONITOR (3-14 DAYS)    4. Benign hypertension  I10     5. Mixed hyperlipidemia  E78.2     6. Former smoker  Z87.891        Recommendation(s):  Orthostatic hypotension Dizziness Cardiology consult is for orthostatic hypotension. Orthostatic vital signs are negative. Home systolic blood pressures on current medical therapy ranges between 140-160 mmHg. Reemphasized importance of changing positions slowly. Increase water  consumption to at least 64 ounces per day. Wear compression stockings especially on the days when she is symptomatic. Given the fact that she is  hypertensive would recommend holding midodrine  and using it on as needed basis. Recommend using midodrine  if SBP is less than 120 mmHg.  Patient and son verbalized understanding Echo will be ordered to evaluate for structural heart disease and left ventricular systolic function. Carotid duplex to rule out obstructive disease Would benefit initially from ENT consult to rule out BPPV and if overall workup is negative then neurology workup for central vertigo. Patient is advised to seek medical attention by going to the closest ER via EMS if her dizziness is associated with projectile vomiting and focal neurological deficits as these symptoms may suggest posterior circulation stroke.  Palpitations Cardiac monitor, 2 weeks to evaluate for dysrhythmias Reemphasized importance of reducing caffeinated beverages, keeping herself well-hydrated Patient is advised to seek medical attention by going to the closest ER via EMS if she has a syncopal event  Benign hypertension Medication changes as discussed above Cardiology following peripherally, managed by primary care provider.  Mixed hyperlipidemia Continue Crestor  5 mg p.o. daily  Former smoker Patient states that she has stopped smoking as of 2023. Congratulated on her efforts.   Orders Placed:  Orders Placed This Encounter  Procedures   LONG TERM MONITOR (3-14 DAYS)    Standing Status:   Future    Number of Occurrences:   1    Expiration Date:   09/16/2024    Where should this test be performed?:   [facility]    Does the patient have an implanted cardiac device?:   No    Prescribed days of wear:   14    Type of enrollment:   Home Enrollment    Vendor::   [employer]    Reason for Exam:   Palpitations R00.2   EKG 12-Lead   ECHOCARDIOGRAM COMPLETE    Standing Status:   Future    Expiration Date:   09/16/2024    [facility] performed[facility]    Does the patient weigh less than or greater than 250 lbs?:   Patient  weighs less than 250 lbs    Perflutren DEFINITY (image enhancing agent) should be administered unless hypersensitivity or allergy exist:   Administer Perflutren    Reason for exam-Echo:   Other-Full Diagnosis List    Full ICD-10/Reason for Exam:   Palpitation [202615]     Final Medication List:   No orders of the defined types were placed in this encounter.   Medications Discontinued During This Encounter  Medication Reason   mometasone -formoterol  (DULERA ) 200-5 MCG/ACT AERO Change in therapy   nicotine  (NICODERM CQ  - DOSED IN MG/24 HOURS) 14 mg/24hr patch Patient Preference   oxyCODONE  (OXY IR/ROXICODONE ) 5 MG immediate  release tablet Patient Preference   polyethylene glycol (MIRALAX  / GLYCOLAX ) 17 g packet Patient Preference   senna-docusate (SENOKOT-S) 8.6-50 MG tablet Patient Preference   umeclidinium bromide  (INCRUSE ELLIPTA ) 62.5 MCG/ACT AEPB Patient Preference   simethicone  (MYLICON) 80 MG chewable tablet Change in therapy     Current Outpatient Medications:    acetaminophen  (TYLENOL ) 325 MG tablet, Take 2 tablets (650 mg total) by mouth every 6 (six) hours as needed for mild pain (or Fever >/= 101)., Disp: , Rfl:    albuterol  (VENTOLIN  HFA) 108 (90 Base) MCG/ACT inhaler, Inhale 1 puff into the lungs every 4 (four) hours as needed for wheezing or shortness of breath., Disp: , Rfl:    Aspirin -Salicylamide-Caffeine (BC HEADACHE POWDER PO), Take 1 packet by mouth 2 (two) times daily as needed (pain/headache)., Disp: , Rfl:    diazepam  (VALIUM ) 5 MG tablet, Take 1 tablet (5 mg total) by mouth every 8 (eight) hours as needed for anxiety., Disp: 10 tablet, Rfl: 0   feeding supplement (ENSURE ENLIVE / ENSURE PLUS) LIQD, Take 237 mLs by mouth 3 (three) times daily between meals., Disp: 237 mL, Rfl: 12   midodrine  (PROAMATINE ) 5 MG tablet, Take 1 tablet (5 mg total) by mouth 3 (three) times daily., Disp: 90 tablet, Rfl: 1   Multiple Vitamin (MULTIVITAMIN WITH MINERALS) TABS tablet, Take 1  tablet by mouth every morning. Centrum, Disp: , Rfl:    polyvinyl alcohol (LIQUIFILM TEARS) 1.4 % ophthalmic solution, Place 1 drop into both eyes daily as needed for dry eyes., Disp: , Rfl:    predniSONE (DELTASONE) 10 MG tablet, Take 10 mg by mouth daily with breakfast., Disp: , Rfl:    rosuvastatin  (CRESTOR ) 5 MG tablet, Take 5 mg by mouth every morning., Disp: , Rfl:    aspirin  81 MG chewable tablet, Chew 1 tablet (81 mg total) by mouth 2 (two) times daily. (Patient not taking: Reported on 09/17/2023), Disp: , Rfl:   Consent:   N/A  Disposition:   As needed if she is asymptomatic going forward.   Otherwise 1 year follow-up sooner if needed  Her questions and concerns were addressed to her satisfaction. She voices understanding of the recommendations provided during this encounter.    Signed, Madonna Michele HAS, [facility]  A Division of [facility] [address], [place], [place] [address]  09/17/2023 6:18 PM

## 2023-09-17 NOTE — Patient Instructions (Signed)
 Medication Instructions:  Your physician has recommended you make the following change in your medication:  TAKE: midodrine  as needed for BP less than 120  *If you need a refill on your cardiac medications before your next appointment, please call your pharmacy*  Lab Work: NONE  If you have labs (blood work) drawn today and your tests are completely normal, you will receive your results only by: MyChart Message (if you have MyChart) OR A paper copy in the mail If you have any lab test that is abnormal or we need to change your treatment, we will call you to review the results.  Testing/Procedures: Your physician has requested that you have an echocardiogram. Echocardiography is a painless test that uses sound waves to create images of your heart. It provides your doctor with information about the size and shape of your heart and how well your heart's chambers and valves are working. This procedure takes approximately one hour. There are no restrictions for this procedure. Please do NOT wear cologne, perfume, aftershave, or lotions (deodorant is allowed). Please arrive 15 minutes prior to your appointment time.  Please note: We ask at that you not bring children with you during ultrasound (echo/ vascular) testing. Due to room size and safety concerns, children are not allowed in the ultrasound rooms during exams. Our front office staff cannot provide observation of children in our lobby area while testing is being conducted. An adult accompanying a patient to their appointment will only be allowed in the ultrasound room at the discretion of the ultrasound technician under special circumstances. We apologize for any inconvenience.   Your physician has requested that you have a carotid duplex. This test is an ultrasound of the carotid arteries in your neck. It looks at blood flow through these arteries that supply the brain with blood. Allow one hour for this exam. There are no restrictions or  special instructions.   Your physician has requested that you wear a heart monitor.   Follow-Up:As needed  At [facility], you and your health needs are our priority.  As part of our continuing mission to provide you with exceptional heart care, our providers are all part of one team.  This team includes your primary Cardiologist (physician) and Advanced Practice Providers or APPs (Physician Assistants and Nurse Practitioners) who all work together to provide you with the care you need, when you need it.   Provider:   Madonna Large, DO   We recommend signing up for the patient portal called MyChart.  Sign up information is provided on this After Visit Summary.  MyChart is used to connect with patients for Virtual Visits (Telemedicine).  Patients are able to view lab/test results, encounter notes, upcoming appointments, etc.  Non-urgent messages can be sent to your provider as well.   To learn more about what you can do with MyChart, go to [email or web address].   Other Instructions ZIO XT- Long Term Monitor Instructions  Your physician has requested you wear a ZIO patch monitor for 14 days.  This is a single patch monitor. [employer] supplies one patch monitor per enrollment. Additional stickers are not available. Please do not apply patch if you will be having a Nuclear Stress Test,  Cardiac CT, MRI, or Chest Xray during the period you would be wearing the  monitor. The patch cannot be worn during these tests. You cannot remove and re-apply the  ZIO XT patch monitor.  Your ZIO patch monitor will be mailed 3 day USPS to your  address on file. It may take 3-5 days  to receive your monitor after you have been enrolled.  Once you have received your monitor, please review the enclosed instructions. Your monitor  has already been registered assigning a specific monitor serial # to you.  Billing and Patient Assistance Program Information  We have supplied [employer] with any of your  insurance information on file for billing purposes. [employer] offers a sliding scale Patient Assistance Program for patients that do not have  insurance, or whose insurance does not completely cover the cost of the ZIO monitor.  You must apply for the Patient Assistance Program to qualify for this discounted rate.  To apply, please call [employer] at [phone], select option 4, select option 2, ask to apply for  Patient Assistance Program. Meredeth will ask your household income, and how many people  are in your household. They will quote your out-of-pocket cost based on that information.  [employer] will also be able to set up a 32-month, interest-free payment plan if needed.  Applying the monitor   Shave hair from upper left chest.  Hold abrader disc by orange tab. Rub abrader in 40 strokes over the upper left chest as  indicated in your monitor instructions.  Clean area with 4 enclosed alcohol pads. Let dry.  Apply patch as indicated in monitor instructions. Patch will be placed under collarbone on left  side of chest with arrow pointing upward.  Rub patch adhesive wings for 2 minutes. Remove white label marked 1. Remove the white  label marked 2. Rub patch adhesive wings for 2 additional minutes.  While looking in a mirror, press and release button in center of patch. A small green light will  flash 3-4 times. This will be your only indicator that the monitor has been turned on.  Do not shower for the first 24 hours. You may shower after the first 24 hours.  Press the button if you feel a symptom. You will hear a small click. Record Date, Time and  Symptom in the Patient Logbook.  When you are ready to remove the patch, follow instructions on the last 2 pages of Patient  Logbook. Stick patch monitor onto the last page of Patient Logbook.  Place Patient Logbook in the blue and white box. Use locking tab on box and tape box closed  securely. The blue and white box has prepaid postage on  it. Please place it in the mailbox as  soon as possible. Your physician should have your test results approximately 7 days after the  monitor has been mailed back to [facility].  Call [facility] Customer Care at [phone] if you have questions regarding  your ZIO XT patch monitor. Call them immediately if you see an orange light blinking on your  monitor.  If your monitor falls off in less than 4 days, contact our Monitor department at [phone].  If your monitor becomes loose or falls off after 4 days call [employer] at [phone] for  suggestions on securing your monitor

## 2023-10-08 DIAGNOSIS — R002 Palpitations: Secondary | ICD-10-CM | POA: Diagnosis not present

## 2023-10-08 DIAGNOSIS — R42 Dizziness and giddiness: Secondary | ICD-10-CM | POA: Diagnosis not present

## 2023-10-13 ENCOUNTER — Ambulatory Visit: Payer: Self-pay | Admitting: Cardiology

## 2023-10-13 DIAGNOSIS — R42 Dizziness and giddiness: Secondary | ICD-10-CM | POA: Diagnosis not present

## 2023-10-13 DIAGNOSIS — R002 Palpitations: Secondary | ICD-10-CM | POA: Diagnosis not present

## 2023-10-25 IMAGING — CT CT HEAD W/O CM
4 series · 17 of 47 positions shown, 19 images · non-contrast
Comparison: 05/18/2019

CLINICAL DATA: Head trauma, moderate-severe



[Series 2: head wo · axial · 0.41mm/px · z∈[+13,+123]mm · 7 of 30 slices shown, 9 images]
[im 4/30  brain]
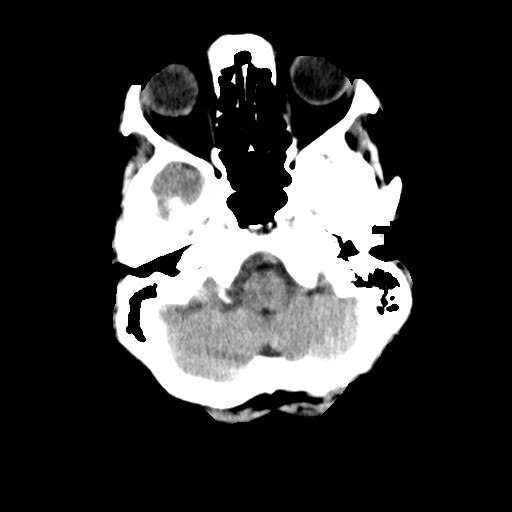
[im 4/30  bone]
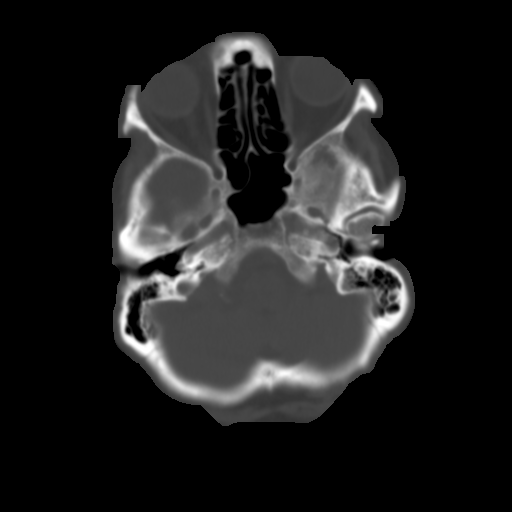
[im 8/30  brain]
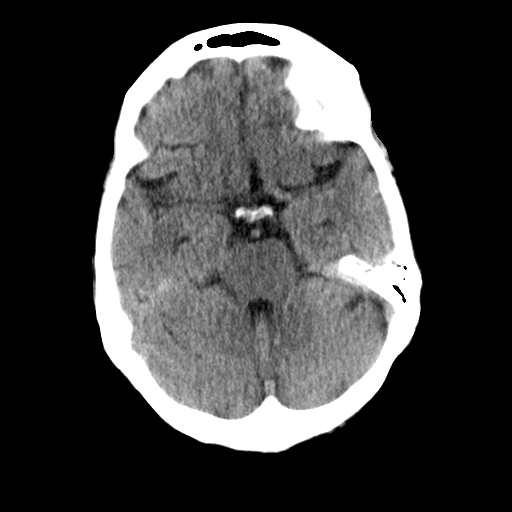
[im 11/30  brain]
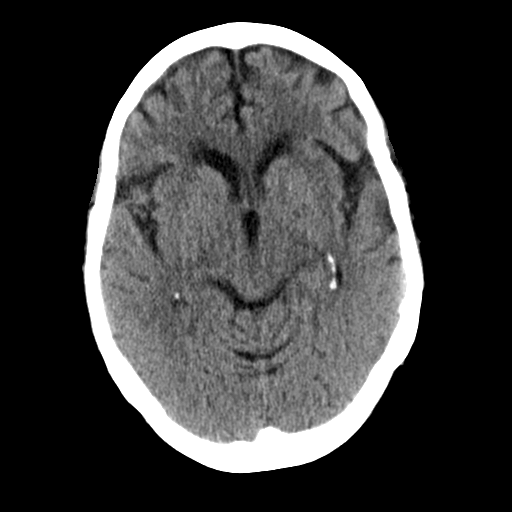
[im 15/30  brain]
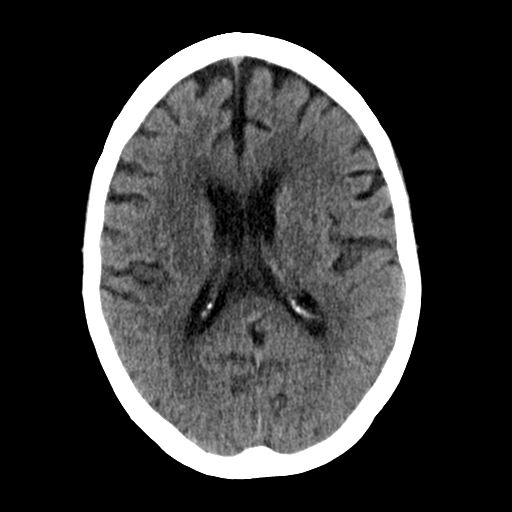
[im 19/30  brain]
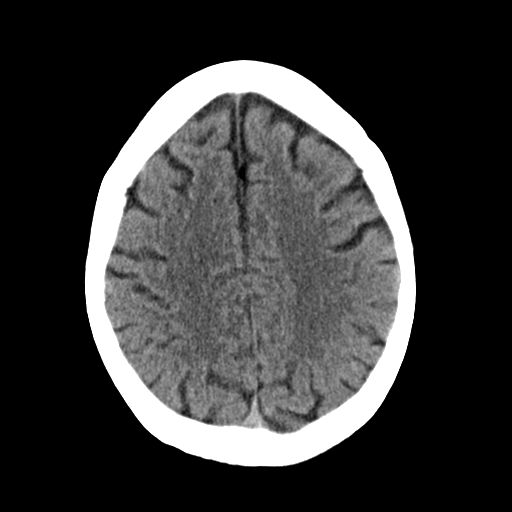
[im 19/30  bone]
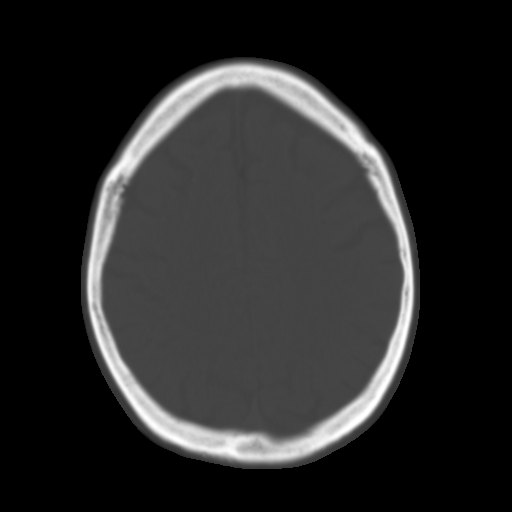
[im 22/30  brain]
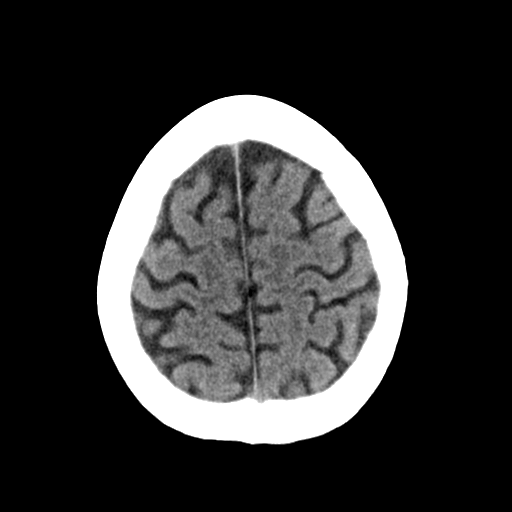
[im 26/30  brain]
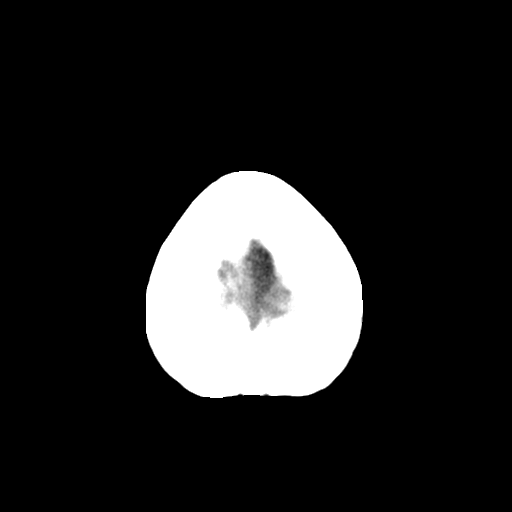

[Series 3: head bone · axial · 0.41mm/px · z∈[+12,+62]mm · 4 of 74 slices shown]
[im 8/74  bone]
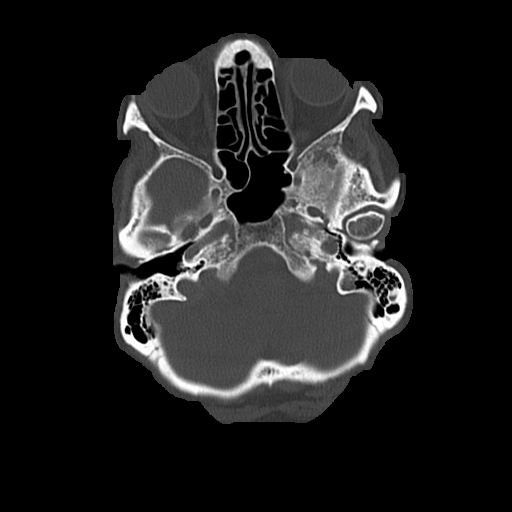
[im 15/74  bone]
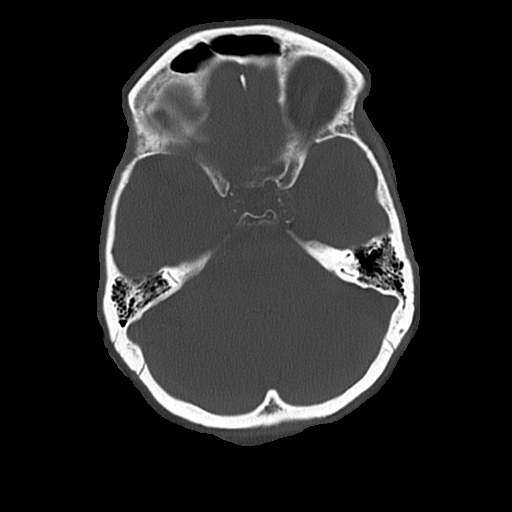
[im 22/74  bone]
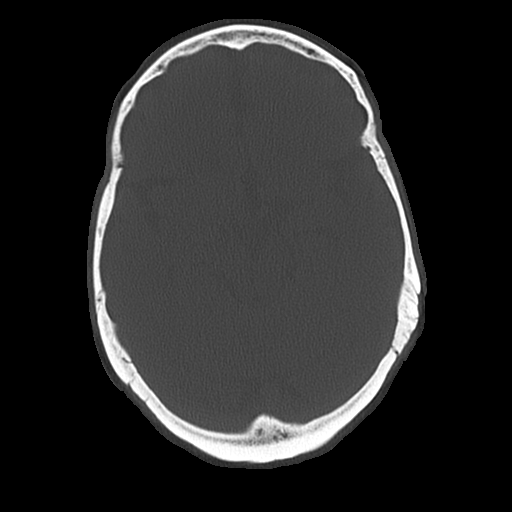
[im 33/74  bone]
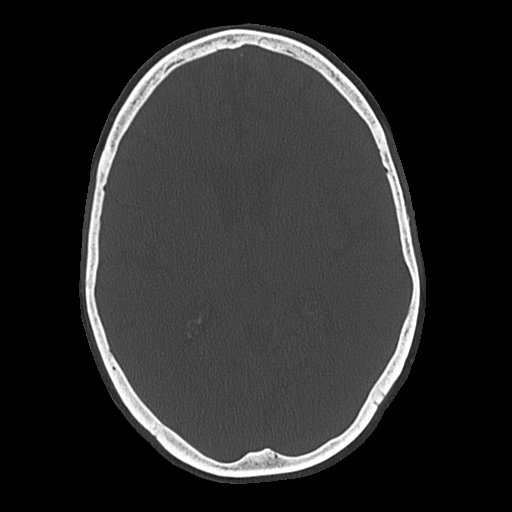

[Series 4: coronal soft · coronal · 0.33mm/px · 3 of 66 slices shown]
[im 22/66  brain]
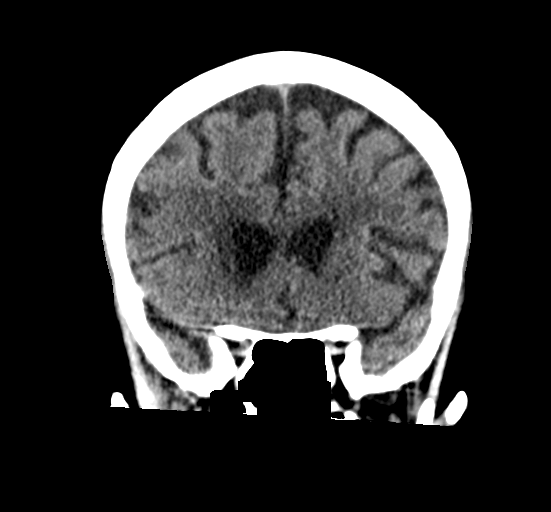
[im 29/66  brain]
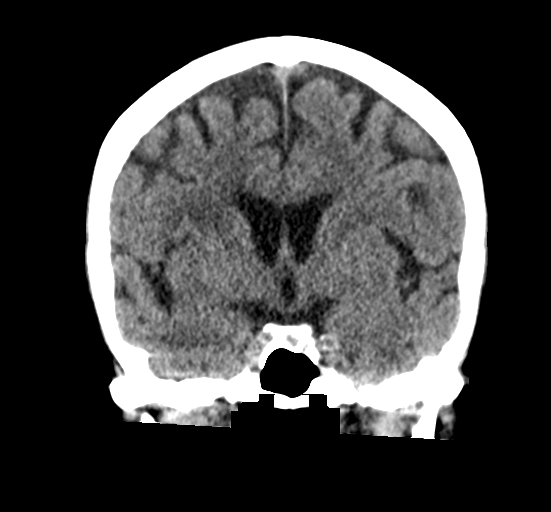
[im 37/66  brain]
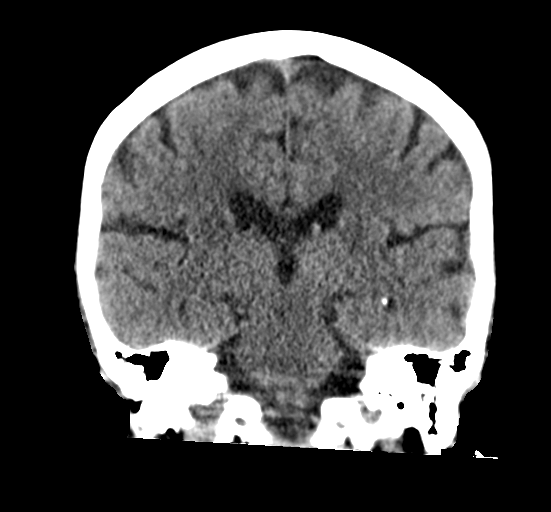

[Series 5: sagittal soft · sagittal · 0.33mm/px · 3 of 50 slices shown]
[im 17/50  brain]
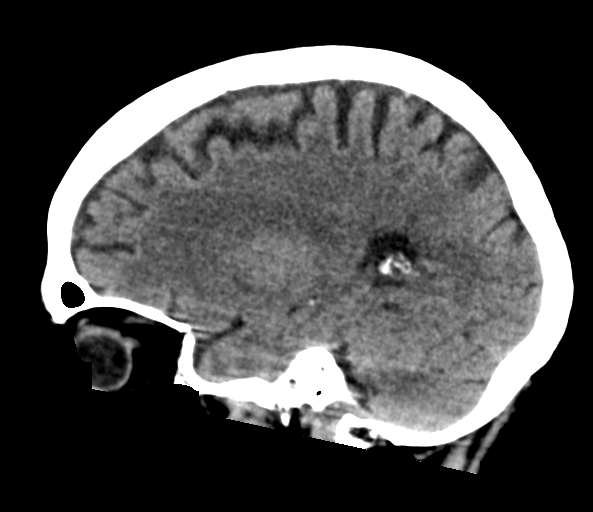
[im 25/50  brain]
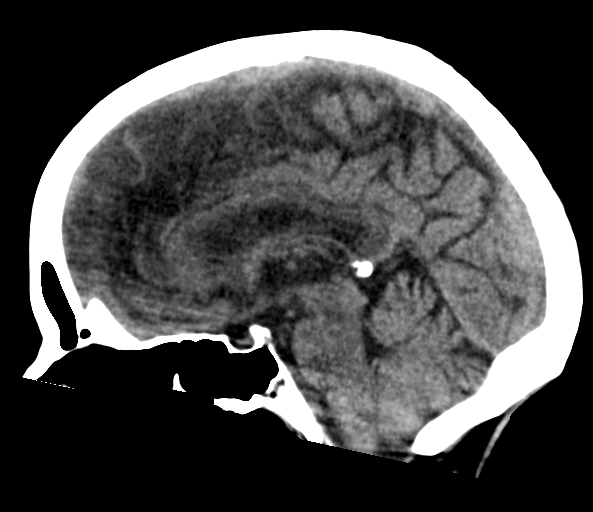
[im 33/50  brain]
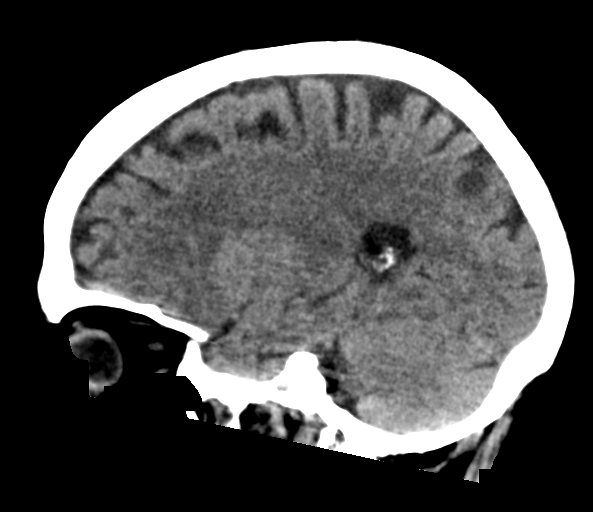

[17 of 47 positions shown; findings below may reference images not displayed]

FINDINGS: Brain: No evidence of acute infarction, hemorrhage, hydrocephalus,
extra-axial collection or mass lesion/mass effect. Scattered
low-density changes within the periventricular and subcortical white
matter compatible with chronic microvascular ischemic change. Mild
diffuse cerebral volume loss.

Vascular: Atherosclerotic calcifications involving the large vessels
of the skull base. No unexpected hyperdense vessel.

Skull: Normal. Negative for fracture or focal lesion.

Sinuses/Orbits: No acute finding.

Other: None.
IMPRESSION: 1. No acute intracranial abnormality.
2. Chronic microvascular ischemic change and cerebral volume loss.

## 2023-10-28 ENCOUNTER — Ambulatory Visit (HOSPITAL_COMMUNITY)
Admission: RE | Admit: 2023-10-28 | Discharge: 2023-10-28 | Disposition: A | Discharge status: Home / Self Care | Source: Ambulatory Visit | Attending: Cardiology | Admitting: Cardiology

## 2023-10-28 ENCOUNTER — Ambulatory Visit (HOSPITAL_BASED_OUTPATIENT_CLINIC_OR_DEPARTMENT_OTHER)
Admission: RE | Admit: 2023-10-28 | Discharge: 2023-10-28 | Disposition: A | Discharge status: Home / Self Care | Source: Ambulatory Visit | Attending: Cardiology | Admitting: Cardiology

## 2023-10-28 DIAGNOSIS — I349 Nonrheumatic mitral valve disorder, unspecified: Secondary | ICD-10-CM

## 2023-10-28 DIAGNOSIS — I080 Rheumatic disorders of both mitral and aortic valves: Secondary | ICD-10-CM | POA: Diagnosis not present

## 2023-10-28 DIAGNOSIS — R42 Dizziness and giddiness: Secondary | ICD-10-CM | POA: Diagnosis not present

## 2023-10-28 DIAGNOSIS — R002 Palpitations: Secondary | ICD-10-CM

## 2023-10-28 DIAGNOSIS — Z87891 Personal history of nicotine dependence: Secondary | ICD-10-CM | POA: Insufficient documentation

## 2023-10-28 DIAGNOSIS — E785 Hyperlipidemia, unspecified: Secondary | ICD-10-CM | POA: Insufficient documentation

## 2023-10-28 LAB — ECHOCARDIOGRAM COMPLETE
AR max vel: 2.15 cm2
AV Area VTI: 2.02 cm2
AV Area mean vel: 2.17 cm2
AV Mean grad: 2 mmHg
AV Peak grad: 4.8 mmHg
Ao pk vel: 1.1 m/s
Area-P 1/2: 2.76 cm2
P 1/2 time: 1156 ms
S' Lateral: 2.46 cm

## 2023-11-18 ENCOUNTER — Telehealth: Payer: Self-pay | Admitting: Cardiology

## 2023-11-18 NOTE — Telephone Encounter (Signed)
 Sunit Itta Bena, DO 11/04/2023 12:51 PM EST     Bilateral carotid artery atherosclerosis. These findings do not explain the dizziness/vertigo Continue aspirin  and rosuvastatin    Dr. Michele   The patient has been notified of the result and verbalized understanding.  All questions (if any) were answered. Sarah Wheatley Chauvigne, RN 11/18/2023 4:44 PM

## 2023-11-18 NOTE — Telephone Encounter (Signed)
  Patient is calling to get her carotid result
